# Patient Record
Sex: Female | Born: 1980 | Race: White | Hispanic: No | Marital: Single | Smoking: Former smoker
Health system: Southern US, Community
[De-identification: ages and names within clinical notes are randomized; demographics above are authoritative.]

## PROBLEM LIST (undated history)

## (undated) DIAGNOSIS — Z789 Other specified health status: Secondary | ICD-10-CM

## (undated) HISTORY — PX: NO PAST SURGERIES: SHX2092

## (undated) HISTORY — DX: Other specified health status: Z78.9

---

## 2020-04-18 ENCOUNTER — Encounter: Payer: Self-pay | Admitting: Obstetrics and Gynecology

## 2020-04-23 ENCOUNTER — Other Ambulatory Visit: Payer: Self-pay

## 2020-06-01 ENCOUNTER — Other Ambulatory Visit: Payer: Self-pay | Admitting: Obstetrics and Gynecology

## 2020-06-01 DIAGNOSIS — Z3A20 20 weeks gestation of pregnancy: Secondary | ICD-10-CM

## 2020-06-04 ENCOUNTER — Encounter: Payer: Self-pay | Admitting: *Deleted

## 2020-06-06 ENCOUNTER — Ambulatory Visit: Payer: No Typology Code available for payment source | Admitting: *Deleted

## 2020-06-06 ENCOUNTER — Other Ambulatory Visit: Payer: Self-pay | Admitting: *Deleted

## 2020-06-06 ENCOUNTER — Other Ambulatory Visit: Payer: Self-pay

## 2020-06-06 ENCOUNTER — Ambulatory Visit: Payer: No Typology Code available for payment source

## 2020-06-06 ENCOUNTER — Ambulatory Visit: Payer: No Typology Code available for payment source | Attending: Obstetrics and Gynecology

## 2020-06-06 VITALS — BP 97/63 | HR 76 | Ht 66.0 in

## 2020-06-06 VITALS — Wt 153.7 lb

## 2020-06-06 DIAGNOSIS — O09522 Supervision of elderly multigravida, second trimester: Secondary | ICD-10-CM | POA: Diagnosis present

## 2020-06-06 DIAGNOSIS — O26842 Uterine size-date discrepancy, second trimester: Secondary | ICD-10-CM | POA: Diagnosis not present

## 2020-06-06 DIAGNOSIS — Z3A20 20 weeks gestation of pregnancy: Secondary | ICD-10-CM | POA: Insufficient documentation

## 2020-06-06 DIAGNOSIS — Z363 Encounter for antenatal screening for malformations: Secondary | ICD-10-CM | POA: Diagnosis not present

## 2020-06-06 DIAGNOSIS — Z1379 Encounter for other screening for genetic and chromosomal anomalies: Secondary | ICD-10-CM | POA: Diagnosis present

## 2020-06-06 DIAGNOSIS — O365990 Maternal care for other known or suspected poor fetal growth, unspecified trimester, not applicable or unspecified: Secondary | ICD-10-CM

## 2020-06-06 DIAGNOSIS — Z3A21 21 weeks gestation of pregnancy: Secondary | ICD-10-CM

## 2020-06-11 ENCOUNTER — Telehealth: Payer: Self-pay | Admitting: Genetic Counselor

## 2020-06-11 LAB — MATERNIT21 PLUS CORE+SCA
Fetal Fraction: 8
Monosomy X (Turner Syndrome): NOT DETECTED
Result (T21): NEGATIVE
Trisomy 13 (Patau syndrome): NEGATIVE
Trisomy 18 (Edwards syndrome): NEGATIVE
Trisomy 21 (Down syndrome): NEGATIVE
XXX (Triple X Syndrome): NOT DETECTED
XXY (Klinefelter Syndrome): NOT DETECTED
XYY (Jacobs Syndrome): NOT DETECTED

## 2020-06-11 NOTE — Telephone Encounter (Signed)
I called Ms. Castille to discuss her negative noninvasive prenatal screening (NIPS)/cell free DNA (cfDNA) testing result. Specifically, Ms. Baskerville had MaterniT21 testing through [employer]. Testing was offered because of a two vessel cord identified on her anatomy ultrasound. These negative results demonstrated an expected representation of chromosome 21, 18, 13, and sex chromosome material, greatly reducing the likelihood of trisomies 28, 62, or 60 and sex chromosome aneuploidies for the pregnancy. The expected fetal sex was confirmed to be female. Ms. Esch was counseled that the two vessel cord on ultrasound is likely a normal variant in light of these results.  NIPS analyzes placental (fetal) DNA in maternal circulation. NIPS is considered to be highly specific and sensitive, but is not considered to be a diagnostic test. This testing identifies 91-99% of pregnancies with trisomies 21, 13, and 18, as well as sex chromosome abnormalities, but does not test for all genetic conditions. Diagnostic testing via amniocentesis is available should she be interested in confirming this result. She confirmed that she had no questions about these results at this time.  Gershon Crane, MS, [facility] Genetic Counselor

## 2020-06-13 ENCOUNTER — Encounter: Payer: Self-pay | Admitting: Pediatric Cardiology

## 2020-06-27 ENCOUNTER — Other Ambulatory Visit: Payer: Self-pay | Admitting: Obstetrics

## 2020-06-27 ENCOUNTER — Ambulatory Visit: Payer: No Typology Code available for payment source | Attending: Obstetrics and Gynecology

## 2020-06-27 ENCOUNTER — Ambulatory Visit: Payer: No Typology Code available for payment source | Admitting: *Deleted

## 2020-06-27 ENCOUNTER — Other Ambulatory Visit: Payer: Self-pay

## 2020-06-27 VITALS — BP 95/72 | HR 90

## 2020-06-27 DIAGNOSIS — O365990 Maternal care for other known or suspected poor fetal growth, unspecified trimester, not applicable or unspecified: Secondary | ICD-10-CM | POA: Diagnosis not present

## 2020-06-27 DIAGNOSIS — O09522 Supervision of elderly multigravida, second trimester: Secondary | ICD-10-CM | POA: Diagnosis not present

## 2020-06-27 DIAGNOSIS — O09529 Supervision of elderly multigravida, unspecified trimester: Secondary | ICD-10-CM | POA: Diagnosis present

## 2020-06-27 DIAGNOSIS — O26842 Uterine size-date discrepancy, second trimester: Secondary | ICD-10-CM

## 2020-06-27 DIAGNOSIS — O365920 Maternal care for other known or suspected poor fetal growth, second trimester, not applicable or unspecified: Secondary | ICD-10-CM

## 2020-06-27 DIAGNOSIS — Z3A23 23 weeks gestation of pregnancy: Secondary | ICD-10-CM

## 2020-06-28 ENCOUNTER — Other Ambulatory Visit: Payer: Self-pay | Admitting: *Deleted

## 2020-07-02 ENCOUNTER — Encounter: Payer: Self-pay | Admitting: Obstetrics and Gynecology

## 2020-07-03 ENCOUNTER — Ambulatory Visit: Payer: No Typology Code available for payment source | Attending: Obstetrics and Gynecology

## 2020-07-03 ENCOUNTER — Ambulatory Visit: Payer: No Typology Code available for payment source | Admitting: *Deleted

## 2020-07-03 ENCOUNTER — Other Ambulatory Visit: Payer: Self-pay

## 2020-07-03 ENCOUNTER — Encounter: Payer: Self-pay | Admitting: *Deleted

## 2020-07-03 VITALS — BP 103/63 | HR 77

## 2020-07-03 DIAGNOSIS — O26842 Uterine size-date discrepancy, second trimester: Secondary | ICD-10-CM

## 2020-07-03 DIAGNOSIS — O09522 Supervision of elderly multigravida, second trimester: Secondary | ICD-10-CM

## 2020-07-03 DIAGNOSIS — O09529 Supervision of elderly multigravida, unspecified trimester: Secondary | ICD-10-CM

## 2020-07-03 DIAGNOSIS — O365920 Maternal care for other known or suspected poor fetal growth, second trimester, not applicable or unspecified: Secondary | ICD-10-CM

## 2020-07-03 DIAGNOSIS — Z3A24 24 weeks gestation of pregnancy: Secondary | ICD-10-CM

## 2020-07-10 ENCOUNTER — Ambulatory Visit: Payer: No Typology Code available for payment source | Attending: Obstetrics and Gynecology

## 2020-07-10 ENCOUNTER — Encounter: Payer: Self-pay | Admitting: *Deleted

## 2020-07-10 ENCOUNTER — Other Ambulatory Visit: Payer: Self-pay

## 2020-07-10 ENCOUNTER — Ambulatory Visit: Payer: No Typology Code available for payment source | Admitting: *Deleted

## 2020-07-10 VITALS — BP 96/64 | HR 74

## 2020-07-10 DIAGNOSIS — O368920 Maternal care for other specified fetal problems, second trimester, not applicable or unspecified: Secondary | ICD-10-CM | POA: Diagnosis not present

## 2020-07-10 DIAGNOSIS — O365920 Maternal care for other known or suspected poor fetal growth, second trimester, not applicable or unspecified: Secondary | ICD-10-CM | POA: Diagnosis not present

## 2020-07-10 DIAGNOSIS — O365990 Maternal care for other known or suspected poor fetal growth, unspecified trimester, not applicable or unspecified: Secondary | ICD-10-CM

## 2020-07-10 DIAGNOSIS — O09522 Supervision of elderly multigravida, second trimester: Secondary | ICD-10-CM | POA: Diagnosis not present

## 2020-07-10 DIAGNOSIS — Z3A25 25 weeks gestation of pregnancy: Secondary | ICD-10-CM

## 2020-07-10 DIAGNOSIS — O322XX0 Maternal care for transverse and oblique lie, not applicable or unspecified: Secondary | ICD-10-CM | POA: Diagnosis not present

## 2020-07-17 ENCOUNTER — Ambulatory Visit: Payer: No Typology Code available for payment source

## 2020-07-17 ENCOUNTER — Ambulatory Visit: Payer: No Typology Code available for payment source | Attending: Obstetrics and Gynecology

## 2020-07-17 ENCOUNTER — Other Ambulatory Visit: Payer: Self-pay | Admitting: *Deleted

## 2020-07-17 ENCOUNTER — Ambulatory Visit: Payer: No Typology Code available for payment source | Admitting: *Deleted

## 2020-07-17 ENCOUNTER — Other Ambulatory Visit: Payer: Self-pay

## 2020-07-17 DIAGNOSIS — O365920 Maternal care for other known or suspected poor fetal growth, second trimester, not applicable or unspecified: Secondary | ICD-10-CM | POA: Diagnosis not present

## 2020-07-17 DIAGNOSIS — O09522 Supervision of elderly multigravida, second trimester: Secondary | ICD-10-CM | POA: Diagnosis not present

## 2020-07-17 DIAGNOSIS — O26842 Uterine size-date discrepancy, second trimester: Secondary | ICD-10-CM | POA: Diagnosis not present

## 2020-07-17 DIAGNOSIS — Z3A26 26 weeks gestation of pregnancy: Secondary | ICD-10-CM

## 2020-07-17 DIAGNOSIS — O09523 Supervision of elderly multigravida, third trimester: Secondary | ICD-10-CM

## 2020-08-14 ENCOUNTER — Ambulatory Visit: Payer: No Typology Code available for payment source | Admitting: *Deleted

## 2020-08-14 ENCOUNTER — Ambulatory Visit: Payer: No Typology Code available for payment source | Attending: Obstetrics and Gynecology

## 2020-08-14 ENCOUNTER — Other Ambulatory Visit: Payer: Self-pay

## 2020-08-14 ENCOUNTER — Other Ambulatory Visit: Payer: Self-pay | Admitting: *Deleted

## 2020-08-14 DIAGNOSIS — Z3A30 30 weeks gestation of pregnancy: Secondary | ICD-10-CM

## 2020-08-14 DIAGNOSIS — O09523 Supervision of elderly multigravida, third trimester: Secondary | ICD-10-CM | POA: Diagnosis present

## 2020-08-14 DIAGNOSIS — O368930 Maternal care for other specified fetal problems, third trimester, not applicable or unspecified: Secondary | ICD-10-CM

## 2020-08-14 DIAGNOSIS — O26843 Uterine size-date discrepancy, third trimester: Secondary | ICD-10-CM

## 2020-08-21 ENCOUNTER — Ambulatory Visit: Payer: No Typology Code available for payment source

## 2020-09-11 ENCOUNTER — Other Ambulatory Visit: Payer: Self-pay

## 2020-09-11 ENCOUNTER — Ambulatory Visit: Payer: No Typology Code available for payment source | Admitting: *Deleted

## 2020-09-11 ENCOUNTER — Encounter: Payer: Self-pay | Admitting: *Deleted

## 2020-09-11 ENCOUNTER — Ambulatory Visit: Payer: No Typology Code available for payment source | Attending: Maternal & Fetal Medicine

## 2020-09-11 DIAGNOSIS — Z3A34 34 weeks gestation of pregnancy: Secondary | ICD-10-CM

## 2020-09-11 DIAGNOSIS — O09523 Supervision of elderly multigravida, third trimester: Secondary | ICD-10-CM

## 2020-09-11 DIAGNOSIS — O321XX0 Maternal care for breech presentation, not applicable or unspecified: Secondary | ICD-10-CM | POA: Diagnosis not present

## 2020-09-11 DIAGNOSIS — Z362 Encounter for other antenatal screening follow-up: Secondary | ICD-10-CM

## 2020-09-11 DIAGNOSIS — O26843 Uterine size-date discrepancy, third trimester: Secondary | ICD-10-CM

## 2020-09-28 ENCOUNTER — Inpatient Hospital Stay (HOSPITAL_COMMUNITY): Payer: No Typology Code available for payment source | Admitting: Anesthesiology

## 2020-09-28 ENCOUNTER — Ambulatory Visit: Payer: No Typology Code available for payment source | Admitting: *Deleted

## 2020-09-28 ENCOUNTER — Encounter (HOSPITAL_COMMUNITY)
Admission: AD | Disposition: A | Discharge status: Home / Self Care | Payer: Self-pay | Source: Home / Self Care | Attending: Obstetrics & Gynecology

## 2020-09-28 ENCOUNTER — Encounter (HOSPITAL_COMMUNITY): Payer: Self-pay | Admitting: Obstetrics & Gynecology

## 2020-09-28 ENCOUNTER — Other Ambulatory Visit: Payer: Self-pay | Admitting: Obstetrics and Gynecology

## 2020-09-28 ENCOUNTER — Inpatient Hospital Stay (HOSPITAL_COMMUNITY)
Admission: AD | Admit: 2020-09-28 | Discharge: 2020-10-01 | DRG: 788 | Disposition: A | Discharge status: Home / Self Care | Payer: No Typology Code available for payment source | Attending: Obstetrics & Gynecology | Admitting: Obstetrics & Gynecology

## 2020-09-28 ENCOUNTER — Encounter: Payer: Self-pay | Admitting: *Deleted

## 2020-09-28 ENCOUNTER — Other Ambulatory Visit: Payer: Self-pay

## 2020-09-28 ENCOUNTER — Other Ambulatory Visit: Payer: Self-pay | Admitting: *Deleted

## 2020-09-28 ENCOUNTER — Ambulatory Visit (HOSPITAL_BASED_OUTPATIENT_CLINIC_OR_DEPARTMENT_OTHER): Payer: No Typology Code available for payment source

## 2020-09-28 DIAGNOSIS — Z3A37 37 weeks gestation of pregnancy: Secondary | ICD-10-CM | POA: Diagnosis not present

## 2020-09-28 DIAGNOSIS — O99824 Streptococcus B carrier state complicating childbirth: Secondary | ICD-10-CM | POA: Diagnosis present

## 2020-09-28 DIAGNOSIS — O43123 Velamentous insertion of umbilical cord, third trimester: Secondary | ICD-10-CM | POA: Diagnosis present

## 2020-09-28 DIAGNOSIS — Q513 Bicornate uterus: Secondary | ICD-10-CM | POA: Diagnosis not present

## 2020-09-28 DIAGNOSIS — O403XX0 Polyhydramnios, third trimester, not applicable or unspecified: Secondary | ICD-10-CM | POA: Insufficient documentation

## 2020-09-28 DIAGNOSIS — O09899 Supervision of other high risk pregnancies, unspecified trimester: Secondary | ICD-10-CM

## 2020-09-28 DIAGNOSIS — O326XX0 Maternal care for compound presentation, not applicable or unspecified: Secondary | ICD-10-CM | POA: Diagnosis present

## 2020-09-28 DIAGNOSIS — O26843 Uterine size-date discrepancy, third trimester: Secondary | ICD-10-CM | POA: Diagnosis not present

## 2020-09-28 DIAGNOSIS — O99892 Other specified diseases and conditions complicating childbirth: Secondary | ICD-10-CM | POA: Diagnosis not present

## 2020-09-28 DIAGNOSIS — O3403 Maternal care for unspecified congenital malformation of uterus, third trimester: Secondary | ICD-10-CM | POA: Diagnosis present

## 2020-09-28 DIAGNOSIS — O09523 Supervision of elderly multigravida, third trimester: Secondary | ICD-10-CM | POA: Diagnosis not present

## 2020-09-28 DIAGNOSIS — O3483 Maternal care for other abnormalities of pelvic organs, third trimester: Secondary | ICD-10-CM | POA: Diagnosis present

## 2020-09-28 DIAGNOSIS — Z3A36 36 weeks gestation of pregnancy: Secondary | ICD-10-CM

## 2020-09-28 DIAGNOSIS — Z87891 Personal history of nicotine dependence: Secondary | ICD-10-CM

## 2020-09-28 DIAGNOSIS — O365930 Maternal care for other known or suspected poor fetal growth, third trimester, not applicable or unspecified: Secondary | ICD-10-CM | POA: Diagnosis present

## 2020-09-28 DIAGNOSIS — Z20822 Contact with and (suspected) exposure to covid-19: Secondary | ICD-10-CM | POA: Diagnosis present

## 2020-09-28 DIAGNOSIS — N83201 Unspecified ovarian cyst, right side: Secondary | ICD-10-CM | POA: Diagnosis present

## 2020-09-28 DIAGNOSIS — O43193 Other malformation of placenta, third trimester: Secondary | ICD-10-CM

## 2020-09-28 LAB — TYPE AND SCREEN
ABO/RH(D): O POS
Antibody Screen: NEGATIVE

## 2020-09-28 LAB — OB RESULTS CONSOLE RUBELLA ANTIBODY, IGM: Rubella: IMMUNE

## 2020-09-28 LAB — CBC
HCT: 35.1 % — ABNORMAL LOW (ref 36.0–46.0)
Hemoglobin: 11.9 g/dL — ABNORMAL LOW (ref 12.0–15.0)
MCH: 31.8 pg (ref 26.0–34.0)
MCHC: 33.9 g/dL (ref 30.0–36.0)
MCV: 93.9 fL (ref 80.0–100.0)
Platelets: 195 10*3/uL (ref 150–400)
RBC: 3.74 MIL/uL — ABNORMAL LOW (ref 3.87–5.11)
RDW: 13.2 % (ref 11.5–15.5)
WBC: 10.5 10*3/uL (ref 4.0–10.5)
nRBC: 0 % (ref 0.0–0.2)

## 2020-09-28 LAB — OB RESULTS CONSOLE GC/CHLAMYDIA
Chlamydia: NEGATIVE
Gonorrhea: NEGATIVE

## 2020-09-28 LAB — OB RESULTS CONSOLE RPR: RPR: NONREACTIVE

## 2020-09-28 LAB — OB RESULTS CONSOLE ANTIBODY SCREEN: Antibody Screen: NEGATIVE

## 2020-09-28 LAB — OB RESULTS CONSOLE ABO/RH: RH Type: POSITIVE

## 2020-09-28 LAB — GROUP B STREP BY PCR: Group B strep by PCR: POSITIVE — AB

## 2020-09-28 LAB — RESPIRATORY PANEL BY RT PCR (FLU A&B, COVID)
Influenza A by PCR: NEGATIVE
Influenza B by PCR: NEGATIVE
SARS Coronavirus 2 by RT PCR: NEGATIVE

## 2020-09-28 LAB — OB RESULTS CONSOLE HEPATITIS B SURFACE ANTIGEN: Hepatitis B Surface Ag: NEGATIVE

## 2020-09-28 LAB — OB RESULTS CONSOLE HIV ANTIBODY (ROUTINE TESTING): HIV: NONREACTIVE

## 2020-09-28 SURGERY — Surgical Case
Anesthesia: Epidural

## 2020-09-28 MED ORDER — PROMETHAZINE HCL 25 MG/ML IJ SOLN
12.5000 mg | Freq: Once | INTRAMUSCULAR | Status: AC
  Administered 2020-09-28: 12.5 mg via INTRAVENOUS
  Filled 2020-09-28: qty 1

## 2020-09-28 MED ORDER — EPHEDRINE 5 MG/ML INJ: 10.0000 mg | INTRAVENOUS | Status: DC | PRN

## 2020-09-28 MED ORDER — OXYCODONE-ACETAMINOPHEN 5-325 MG PO TABS: 1.0000 | ORAL_TABLET | ORAL | Status: DC | PRN

## 2020-09-28 MED ORDER — DIPHENHYDRAMINE HCL 50 MG/ML IJ SOLN: 12.5000 mg | INTRAMUSCULAR | Status: DC | PRN

## 2020-09-28 MED ORDER — MEPERIDINE HCL 25 MG/ML IJ SOLN
INTRAMUSCULAR | Status: AC
  Filled 2020-09-28: qty 1

## 2020-09-28 MED ORDER — LACTATED RINGERS IV SOLN
500.0000 mL | INTRAVENOUS | Status: DC | PRN
  Administered 2020-09-28: 250 mL via INTRAVENOUS

## 2020-09-28 MED ORDER — LACTATED RINGERS IV SOLN: 500.0000 mL | Freq: Once | INTRAVENOUS | Status: DC

## 2020-09-28 MED ORDER — MEPERIDINE HCL 25 MG/ML IJ SOLN
INTRAMUSCULAR | Status: DC | PRN
Start: 2020-09-28 — End: 2020-09-28
  Administered 2020-09-28: 6.25 mg via INTRAVENOUS

## 2020-09-28 MED ORDER — PHENYLEPHRINE 40 MCG/ML (10ML) SYRINGE FOR IV PUSH (FOR BLOOD PRESSURE SUPPORT): 80.0000 ug | PREFILLED_SYRINGE | INTRAVENOUS | Status: DC | PRN

## 2020-09-28 MED ORDER — TERBUTALINE SULFATE 1 MG/ML IJ SOLN
0.2500 mg | Freq: Once | INTRAMUSCULAR | Status: AC | PRN
  Administered 2020-09-28: 0.25 mg via SUBCUTANEOUS
  Filled 2020-09-28: qty 1

## 2020-09-28 MED ORDER — LIDOCAINE HCL (PF) 2 % IJ SOLN
INTRAMUSCULAR | Status: AC
  Filled 2020-09-28: qty 5

## 2020-09-28 MED ORDER — CEFAZOLIN SODIUM-DEXTROSE 2-4 GM/100ML-% IV SOLN
2.0000 g | Freq: Once | INTRAVENOUS | Status: AC
  Administered 2020-09-28: 2 g via INTRAVENOUS
  Filled 2020-09-28: qty 100

## 2020-09-28 MED ORDER — DEXMEDETOMIDINE (PRECEDEX) IN NS 20 MCG/5ML (4 MCG/ML) IV SYRINGE
PREFILLED_SYRINGE | INTRAVENOUS | Status: DC | PRN
  Administered 2020-09-28 (×5): 4 ug via INTRAVENOUS

## 2020-09-28 MED ORDER — MISOPROSTOL 50MCG HALF TABLET
ORAL_TABLET | ORAL | Status: AC
  Filled 2020-09-28: qty 1

## 2020-09-28 MED ORDER — LACTATED RINGERS IV SOLN: INTRAVENOUS | Status: DC | PRN

## 2020-09-28 MED ORDER — CEFAZOLIN SODIUM-DEXTROSE 2-4 GM/100ML-% IV SOLN
INTRAVENOUS | Status: AC
  Filled 2020-09-28: qty 100

## 2020-09-28 MED ORDER — SODIUM CHLORIDE 0.9 % IR SOLN
Status: DC | PRN
  Administered 2020-09-28: 1000 mL

## 2020-09-28 MED ORDER — SODIUM BICARBONATE 8.4 % IV SOLN
INTRAVENOUS | Status: AC
  Filled 2020-09-28: qty 50

## 2020-09-28 MED ORDER — KETOROLAC TROMETHAMINE 30 MG/ML IJ SOLN
INTRAMUSCULAR | Status: AC
  Filled 2020-09-28: qty 1

## 2020-09-28 MED ORDER — LIDOCAINE HCL (PF) 1 % IJ SOLN: 30.0000 mL | INTRAMUSCULAR | Status: DC | PRN

## 2020-09-28 MED ORDER — MORPHINE SULFATE (PF) 10 MG/ML IV SOLN
INTRAVENOUS | Status: DC | PRN
  Administered 2020-09-28: 3 mg via EPIDURAL

## 2020-09-28 MED ORDER — FENTANYL CITRATE (PF) 100 MCG/2ML IJ SOLN
INTRAMUSCULAR | Status: DC | PRN
  Administered 2020-09-28: 50 ug via INTRAVENOUS

## 2020-09-28 MED ORDER — CEFAZOLIN SODIUM-DEXTROSE 2-3 GM-%(50ML) IV SOLR
INTRAVENOUS | Status: DC | PRN
  Administered 2020-09-28: 2 g via INTRAVENOUS

## 2020-09-28 MED ORDER — ONDANSETRON HCL 4 MG/2ML IJ SOLN
INTRAMUSCULAR | Status: AC
  Filled 2020-09-28: qty 4

## 2020-09-28 MED ORDER — KETOROLAC TROMETHAMINE 30 MG/ML IJ SOLN: 30.0000 mg | Freq: Once | INTRAMUSCULAR | Status: AC | PRN

## 2020-09-28 MED ORDER — PROMETHAZINE HCL 25 MG/ML IJ SOLN: 6.2500 mg | INTRAMUSCULAR | Status: DC | PRN

## 2020-09-28 MED ORDER — SOD CITRATE-CITRIC ACID 500-334 MG/5ML PO SOLN
30.0000 mL | ORAL | Status: DC | PRN
  Administered 2020-09-28: 30 mL via ORAL
  Filled 2020-09-28: qty 15

## 2020-09-28 MED ORDER — DEXMEDETOMIDINE (PRECEDEX) IN NS 20 MCG/5ML (4 MCG/ML) IV SYRINGE
PREFILLED_SYRINGE | INTRAVENOUS | Status: AC
  Filled 2020-09-28: qty 5

## 2020-09-28 MED ORDER — LIDOCAINE-EPINEPHRINE (PF) 2 %-1:200000 IJ SOLN
INTRAMUSCULAR | Status: DC | PRN
  Administered 2020-09-28: 3 mL via EPIDURAL
  Administered 2020-09-28: 10 mL via EPIDURAL

## 2020-09-28 MED ORDER — OXYTOCIN BOLUS FROM INFUSION: 333.0000 mL | Freq: Once | INTRAVENOUS | Status: DC

## 2020-09-28 MED ORDER — ACETAMINOPHEN 325 MG PO TABS: 650.0000 mg | ORAL_TABLET | ORAL | Status: DC | PRN

## 2020-09-28 MED ORDER — FENTANYL CITRATE (PF) 100 MCG/2ML IJ SOLN
INTRAMUSCULAR | Status: AC
  Filled 2020-09-28: qty 2

## 2020-09-28 MED ORDER — PHENYLEPHRINE HCL (PRESSORS) 10 MG/ML IV SOLN
INTRAVENOUS | Status: DC | PRN
  Administered 2020-09-28 (×2): 80 ug via INTRAVENOUS
  Administered 2020-09-28: 40 ug via INTRAVENOUS

## 2020-09-28 MED ORDER — OXYTOCIN-SODIUM CHLORIDE 30-0.9 UT/500ML-% IV SOLN
2.5000 [IU]/h | INTRAVENOUS | Status: DC
  Administered 2020-09-28: 30 [IU] via INTRAVENOUS

## 2020-09-28 MED ORDER — HYDROMORPHONE HCL 1 MG/ML IJ SOLN
0.2500 mg | INTRAMUSCULAR | Status: DC | PRN
  Administered 2020-09-28: 0.5 mg via INTRAVENOUS

## 2020-09-28 MED ORDER — LIDOCAINE HCL (PF) 1 % IJ SOLN
INTRAMUSCULAR | Status: DC | PRN
  Administered 2020-09-28: 9 mL via EPIDURAL

## 2020-09-28 MED ORDER — OXYCODONE-ACETAMINOPHEN 5-325 MG PO TABS: 2.0000 | ORAL_TABLET | ORAL | Status: DC | PRN

## 2020-09-28 MED ORDER — MEPERIDINE HCL 25 MG/ML IJ SOLN: 6.2500 mg | INTRAMUSCULAR | Status: DC | PRN

## 2020-09-28 MED ORDER — DEXAMETHASONE SODIUM PHOSPHATE 4 MG/ML IJ SOLN
INTRAMUSCULAR | Status: DC | PRN
  Administered 2020-09-28: 4 mg via INTRAVENOUS

## 2020-09-28 MED ORDER — MISOPROSTOL 50MCG HALF TABLET
50.0000 ug | ORAL_TABLET | ORAL | Status: DC | PRN
  Administered 2020-09-28: 50 ug via ORAL

## 2020-09-28 MED ORDER — FENTANYL CITRATE (PF) 100 MCG/2ML IJ SOLN
INTRAMUSCULAR | Status: DC | PRN
Start: 2020-09-28 — End: 2020-09-28
  Administered 2020-09-28: 50 ug via EPIDURAL

## 2020-09-28 MED ORDER — FENTANYL-BUPIVACAINE-NACL 0.5-0.125-0.9 MG/250ML-% EP SOLN
12.0000 mL/h | EPIDURAL | Status: DC | PRN
  Filled 2020-09-28: qty 250

## 2020-09-28 MED ORDER — FENTANYL CITRATE (PF) 100 MCG/2ML IJ SOLN
100.0000 ug | INTRAMUSCULAR | Status: DC | PRN
  Administered 2020-09-28 (×2): 100 ug via INTRAVENOUS
  Filled 2020-09-28: qty 2

## 2020-09-28 MED ORDER — KETOROLAC TROMETHAMINE 30 MG/ML IJ SOLN
30.0000 mg | Freq: Four times a day (QID) | INTRAMUSCULAR | Status: DC | PRN
  Administered 2020-09-29: 30 mg via INTRAMUSCULAR

## 2020-09-28 MED ORDER — ONDANSETRON HCL 4 MG/2ML IJ SOLN
4.0000 mg | Freq: Four times a day (QID) | INTRAMUSCULAR | Status: DC | PRN
  Administered 2020-09-28 (×2): 4 mg via INTRAVENOUS
  Filled 2020-09-28: qty 2

## 2020-09-28 MED ORDER — MORPHINE SULFATE (PF) 0.5 MG/ML IJ SOLN
INTRAMUSCULAR | Status: AC
  Filled 2020-09-28: qty 10

## 2020-09-28 MED ORDER — OXYTOCIN 10 UNIT/ML IJ SOLN
INTRAMUSCULAR | Status: AC
  Filled 2020-09-28: qty 4

## 2020-09-28 MED ORDER — CEFAZOLIN SODIUM-DEXTROSE 1-4 GM/50ML-% IV SOLN
1.0000 g | Freq: Three times a day (TID) | INTRAVENOUS | Status: DC
  Filled 2020-09-28: qty 50

## 2020-09-28 MED ORDER — SODIUM CHLORIDE (PF) 0.9 % IJ SOLN
INTRAMUSCULAR | Status: DC | PRN
  Administered 2020-09-28: 12 mL/h via EPIDURAL

## 2020-09-28 MED ORDER — LACTATED RINGERS IV SOLN: INTRAVENOUS | Status: DC

## 2020-09-28 MED ORDER — FLEET ENEMA 7-19 GM/118ML RE ENEM: 1.0000 | ENEMA | RECTAL | Status: DC | PRN

## 2020-09-28 MED ORDER — OXYTOCIN-SODIUM CHLORIDE 30-0.9 UT/500ML-% IV SOLN
INTRAVENOUS | Status: AC
  Filled 2020-09-28: qty 500

## 2020-09-28 MED ORDER — HYDROMORPHONE HCL 1 MG/ML IJ SOLN
INTRAMUSCULAR | Status: AC
  Filled 2020-09-28: qty 0.5

## 2020-09-28 MED ORDER — KETOROLAC TROMETHAMINE 30 MG/ML IJ SOLN: 30.0000 mg | Freq: Four times a day (QID) | INTRAMUSCULAR | Status: DC | PRN

## 2020-09-28 MED ORDER — PHENYLEPHRINE 40 MCG/ML (10ML) SYRINGE FOR IV PUSH (FOR BLOOD PRESSURE SUPPORT)
PREFILLED_SYRINGE | INTRAVENOUS | Status: AC
  Filled 2020-09-28: qty 10

## 2020-09-28 MED ORDER — DEXAMETHASONE SODIUM PHOSPHATE 4 MG/ML IJ SOLN
INTRAMUSCULAR | Status: AC
  Filled 2020-09-28: qty 1

## 2020-09-28 SURGICAL SUPPLY — 40 items
BARRIER ADHS 3X4 INTERCEED (GAUZE/BANDAGES/DRESSINGS) ×3 IMPLANT
BENZOIN TINCTURE PRP APPL 2/3 (GAUZE/BANDAGES/DRESSINGS) ×3 IMPLANT
CHLORAPREP W/TINT 26ML (MISCELLANEOUS) ×3 IMPLANT
CLAMP CORD UMBIL (MISCELLANEOUS) IMPLANT
CLOSURE STERI STRIP 1/2 X4 (GAUZE/BANDAGES/DRESSINGS) ×3 IMPLANT
CLOTH BEACON ORANGE TIMEOUT ST (SAFETY) ×3 IMPLANT
DERMABOND ADVANCED (GAUZE/BANDAGES/DRESSINGS)
DERMABOND ADVANCED .7 DNX12 (GAUZE/BANDAGES/DRESSINGS) IMPLANT
DRAPE C SECTION CLR SCREEN (DRAPES) ×3 IMPLANT
DRSG OPSITE POSTOP 4X10 (GAUZE/BANDAGES/DRESSINGS) ×3 IMPLANT
ELECT REM PT RETURN 9FT ADLT (ELECTROSURGICAL) ×3
ELECTRODE REM PT RTRN 9FT ADLT (ELECTROSURGICAL) ×1 IMPLANT
EXTRACTOR VACUUM M CUP 4 TUBE (SUCTIONS) IMPLANT
EXTRACTOR VACUUM M CUP 4' TUBE (SUCTIONS)
GLOVE BIOGEL PI IND STRL 7.0 (GLOVE) ×2 IMPLANT
GLOVE BIOGEL PI INDICATOR 7.0 (GLOVE) ×4
GLOVE SURG SS PI 6.5 STRL IVOR (GLOVE) ×3 IMPLANT
GOWN STRL REUS W/TWL LRG LVL3 (GOWN DISPOSABLE) ×6 IMPLANT
KIT ABG SYR 3ML LUER SLIP (SYRINGE) IMPLANT
NEEDLE HYPO 25X5/8 SAFETYGLIDE (NEEDLE) IMPLANT
NS IRRIG 1000ML POUR BTL (IV SOLUTION) ×3 IMPLANT
PACK C SECTION WH (CUSTOM PROCEDURE TRAY) ×3 IMPLANT
PAD OB MATERNITY 4.3X12.25 (PERSONAL CARE ITEMS) ×3 IMPLANT
PENCIL SMOKE EVAC W/HOLSTER (ELECTROSURGICAL) ×3 IMPLANT
RTRCTR C-SECT PINK 25CM LRG (MISCELLANEOUS) ×3 IMPLANT
SUT CHROMIC 1 CTX 36 (SUTURE) IMPLANT
SUT CHROMIC 2 0 CT 1 (SUTURE) ×3 IMPLANT
SUT MON AB 4-0 PS1 27 (SUTURE) ×3 IMPLANT
SUT PLAIN 1 NONE 54 (SUTURE) IMPLANT
SUT PLAIN 2 0 (SUTURE) ×2
SUT PLAIN 2 0 XLH (SUTURE) IMPLANT
SUT PLAIN ABS 2-0 CT1 27XMFL (SUTURE) ×1 IMPLANT
SUT VIC AB 0 CTX 36 (SUTURE) ×4
SUT VIC AB 0 CTX36XBRD ANBCTRL (SUTURE) ×2 IMPLANT
SUT VIC AB 1 CTX 36 (SUTURE) ×4
SUT VIC AB 1 CTX36XBRD ANBCTRL (SUTURE) ×2 IMPLANT
SUT VIC AB 4-0 KS 27 (SUTURE) ×3 IMPLANT
TOWEL OR 17X24 6PK STRL BLUE (TOWEL DISPOSABLE) ×3 IMPLANT
TRAY FOLEY W/BAG SLVR 14FR LF (SET/KITS/TRAYS/PACK) ×3 IMPLANT
WATER STERILE IRR 1000ML POUR (IV SOLUTION) ×3 IMPLANT

## 2020-09-28 NOTE — Op Note (Signed)
Audria Nine PROCEDURE DATE: 09/28/2020  PREOPERATIVE DIAGNOSES: Intrauterine pregnancy at [redacted]w[redacted]d weeks gestation; non-reassuring fetal status and cord prolapse  POSTOPERATIVE DIAGNOSES: The same, right ovarian cyst, bicornuate uterus  PROCEDURE: Primary Low Transverse Cesarean Section  SURGEON:  Baldemar Lenis, MD  ASSISTANT:  Ivonne Andrew, CNM Casper Harrison, MD  An experienced assistant was required given the standard of surgical care given the complexity of the case.  This assistant was needed for exposure, dissection, suctioning, retraction, instrument exchange, assisting with delivery with administration of fundal pressure, and for overall help during the procedure.  ANESTHESIOLOGY TEAM: Anesthesiologist: Leilani Able, MD CRNA: Renford Dills, CRNA; Rhymer, Doree Fudge, CRNA  INDICATIONS: Michelle Reid is a 39 y.o. G3P0020 at [redacted]w[redacted]d here for cesarean section secondary to the indications listed under preoperative diagnoses; please see preoperative note for further details.  Risks of c-section not discussed with patient due to urgent nature of case, pt did state she was agreeable to blood transfusion if needed.  FINDINGS:  Viable female infant in cephalic presentation. Very poor tone with no spontaneous movement, respiration or cry. Apgars and cord gas pending. Weight: 2520 gms. Clear amniotic fluid.  Intact placenta, three vessel cord.  Bicornuate uterus with placenta in left horn, enlarged right ovary with cyst.  ANESTHESIA: Epidural INTRAVENOUS FLUIDS: 2000 ml   ESTIMATED BLOOD LOSS:  ml URINE OUTPUT:  300 ml SPECIMENS: Placenta sent to pathology COMPLICATIONS: None immediate  PROCEDURE IN DETAIL:  The patient was taken to the OR urgently for cord prolapse. FHR on arrival to OR in 60s but subsequently improved by palpation for normal. The epidural anesthesia was dosed up to surgical level and was found to be adequate. She was placed in a dorsal supine position with a  leftward tilt. She was prepped and draped in a sterile manner.  A foley catheter was already in place in her bladder.  After a timeout was performed, a Pfannenstiel skin incision was made with scalpel and carried through to the underlying layer of fascia. The fascia was incised in the midline, and this incision was extended bilaterally bluntly. The rectus muscles were separated in the midline bluntly and the peritoneum was entered bluntly. The peritoneal incision was carefully extended bluntly laterally and caudad with good visualization of the bladder. The uterus appeared bicornuate. The Alexis O-ring retractor was placed into the incision, taking care not to incorporate bowel or omentum. The bladder blade was inserted. Attention was turned to the lower uterine segment where a low transverse hysterotomy was made with a scalpel and extended bilaterally bluntly.  The infant was delivered with some difficulty due to the tight incision from cephalic position, cord immediately clamped due to poor tone. Infant handed to waiting NICU staff. Pop felt during delivery of right shoulder, conveyed to NICU staff to check shoulder. Uterine massage was then performed, and the placenta delivered intact with a three-vessel cord. The uterus was then cleared of clots and debris. The hysterotomy was closed with 0 Vicryl in a running locked fashion, and an imbricating layer was also placed with 0 Vicryl. Good hemostasis noted at the uterine incision.  At this point, Dr. Sallye Ober scrubbed in and I turned over the case to her.    Baldemar Lenis, M.D. Attending Obstetrician & Gynecologist, [facility] for [employer], [facility]

## 2020-09-28 NOTE — H&P (Signed)
OB ADMISSION/ HISTORY & PHYSICAL:  Admission Date: 09/28/2020 12:09 PM  Admit Diagnosis: Indication for care in labor or delivery [O75.9]    Michelle Reid is a 39 y.o. female presenting for IOL, direct admit from MFM office. Noted amnion separation in office and confirmed w/ MFM sono today. Recommendation to proceed w/ IOL and delivery.  Patient notes good FM, no LOF/VB. Denies HA/NV/RUQ pain/visual changes.  Partner present and supportive.   Prenatal History: G3P0020   EDC : 10/16/2020, by Last Menstrual Period  Prenatal care at [facility] since 14 weeks.   Prenatal course complicated by: 1. Velamentous CI, no vasa previa, single UA 2. SGA, serial growth wnl, last EFW 20 % at 34 wks 3. AMA 39 yo 4. Mild poly 25.9 cm 5. R ovarian simple cyst stable 8 x 5 x 5.8 cm  Prenatal Labs: ABO, Rh: O (11/12 0000)  Antibody: PENDING (11/12 1235) Rubella: Immune (11/12 0000)  RPR: Nonreactive (11/12 0000)  HBsAg: Negative (11/12 0000)  HIV: Non-reactive (11/12 0000)  GBS:   pending  1 hr Glucola : 110 Genetic Screening: declined Ultrasound: normal XX anatomy, VCI, SUA, SGA, placenta L lateral  Vaccines: TDaP          UTD         Flu             declined                    COVID-19 declined    Maternal Diabetes: No Genetic Screening: Declined Maternal Ultrasounds/Referrals: IUGR and Other: velamentous CI, single UA Fetal Ultrasounds or other Referrals:  Referred to Materal Fetal Medicine  Maternal Substance Abuse:  No Significant Maternal Medications:  None Significant Maternal Lab Results:  None Other Comments:  None  Medical / Surgical History :  Past medical history:  Past Medical History:  Diagnosis Date   Medical history non-contributory      Past surgical history:  Past Surgical History:  Procedure Laterality Date   NO PAST SURGERIES       Family History: No family history on file.   Social History:  reports that she has quit smoking. She has  never used smokeless tobacco. She reports previous alcohol use. No history on file for drug use.   Allergies: Amoxicillin   Current Medications at time of admission:  Medications Prior to Admission  Medication Sig Dispense Refill Last Dose   Ferrous Sulfate (IRON PO) Take by mouth.      FOLIC ACID PO Take by mouth.      Omega-3 Fatty Acids (FISH OIL PO) Take by mouth.      Prenatal Vit-Fe Fumarate-FA (PRENATAL VITAMIN PO) Take by mouth.      VITAMIN A PO Take by mouth.      VITAMIN D PO Take by mouth.      VITAMIN E PO Take by mouth.        Review of Systems: ROS As noted above Physical Exam: Vital signs and nursing notes reviewed.  Patient Vitals for the past 24 hrs:  BP Temp Temp src Pulse Resp Height Weight  09/28/20 1239 113/74 98 F (36.7 C) Oral 69 18 -- --  09/28/20 1231 -- -- -- -- -- 5\' 6"  (1.676 m) 81.2 kg     General: AAO x 3, NAD Heart: RRR Lungs:CTAB Abdomen: Gravid, NT, Leopold's vertex, fetal spine to maternal R Extremities: no edema Genitalia / VE: Dilation: 1 Effacement (%): 50 Station: Ballotable Presentation:  Vertex Exam by:: Nesanel Aguila, cnm   Option for IF discussed to include R/B, patient agrees to procedure Cervical balloon placed w/o difficulty, inflated w/ 60 cc fluid and traction to leg Patient tolerated well.   FHR: 130 BPM, modertae variability, + accels, no decels TOCO: Ctx occasional, mild  Labs:   Pending T&S, RPR  Recent Labs    09/28/20 1251  WBC 10.5  HGB 11.9*  HCT 35.1*  PLT 195    Assessment:  39 y.o. G3P0020 at [redacted]w[redacted]d AMA, velamentous cord insertion, single umbilical artery, SGA with normal growth Amnion separation Mild poly  1. IOL, unfavorable cervix 2. FHR category 1 3. GBS unknown 4. Desires epidural in active labor 5. Breastfeeding planned 6. Placenta disposal per patient request  Plan:  1. Admit to BS 2. Routine L&D orders, cervical ripening with buccal cytotec and IF 3. Analgesia/anesthesia  PRN  4. Anticipate NSVB   Dr Sallye Ober notified of admission / plan of care   Neta Mends CNM, MSN 09/28/2020, 1:59 PM

## 2020-09-28 NOTE — Progress Notes (Signed)
MFM Note  This patient was seen for an ultrasound exam as a possible amnion separation was noted during an ultrasound performed at the office of [facility].  The patient has been followed due to advanced maternal age, a fetus with a two-vessel umbilical cord with probable velamentous cord insertion.  The patient reports feeling vigorous fetal movements throughout the day and denies any problems since her last exam.  She denies any recent falls or trauma to the abdomen or abdominal pain.  On today's exam, fetal movements were noted.  Mild polyhydramnios with a total AFI of 25.9 cm was also noted.  The amnion or gestational sac appears to be separated from her uterus.  Although a normal appearing left lateral placenta was noted without any signs of retroplacental clots, given the amnion separation noted today, a placental abruption cannot be excluded.    At her current gestational age, I would recommend delivery in order to avoid an adverse pregnancy outcome.    As the velamentous cord insertion site is closer to the fundus of her uterus and there were no signs of vasa previa, she may attempt a vaginal delivery.    The patient was sent to the hospital for delivery following today's ultrasound exam.  The appropriate obstetrical providers were notified regarding today's ultrasound findings and recommendation for delivery.

## 2020-09-28 NOTE — Progress Notes (Signed)
Late entry d/t acuity.  2104 - SROM  2105 - RN called me to notify of SROM w/ clear AF 2107 - spontaneous deceleration to 60 BPM, unrelieved by position changes to L lateral 2108 - at patient BS, noted continued decel, SVE rim, vertex, fetal extremities palpated adjacent to head and cord pulsing between the two presenting parts, attempted to push foot and hand cephalad and unable to reduce between contractions.  2111 - FSE placed and patient assisted to hands/knees position, request for FP attending to bedside consult for emergent section 2112 - Terbutaline 0.25 SQ given, call to Dr. Sallye Ober with update and request presence at bedside for operative delivery, Dr. Connye Burkitt and Dr. Myriam Jacobson at [facility] and examined patient, cord prolapse confirmed. 2114 - Patient to OR via bed, Dr. Myriam Jacobson providing counter pressure on presenting part. FHT remained 60 BPM.  Neta Mends, MSN, CNM 09/28/2020, 11:17 PM

## 2020-09-28 NOTE — Transfer of Care (Signed)
Immediate Anesthesia Transfer of Care Note  Patient: Michelle Reid  Procedure(s) Performed: CESAREAN SECTION (N/A )  Patient Location: PACU  Anesthesia Type:Epidural  Level of Consciousness: awake, alert  and oriented  Airway & Oxygen Therapy: Patient Spontanous Breathing  Post-op Assessment: Report given to RN  Post vital signs: Reviewed and stable  Last Vitals:  Vitals Value Taken Time  BP 110/65 09/28/20 2245  Temp    Pulse 101 09/28/20 2246  Resp 15 09/28/20 2246  SpO2 95 % 09/28/20 2246  Vitals shown include unvalidated device data.  Last Pain:  Vitals:   09/28/20 2025  TempSrc:   PainSc: 0-No pain         Complications: No complications documented.

## 2020-09-28 NOTE — Anesthesia Procedure Notes (Addendum)
Epidural Patient location during procedure: OB Start time: 09/28/2020 6:44 PM End time: 09/28/2020 6:46 PM  Staffing Anesthesiologist: Leilani Able, MD Performed: anesthesiologist   Preanesthetic Checklist Completed: patient identified, IV checked, site marked, risks and benefits discussed, surgical consent, monitors and equipment checked, pre-op evaluation and timeout performed  Epidural Patient position: sitting Prep: DuraPrep and site prepped and draped Patient monitoring: continuous pulse ox and blood pressure Approach: midline Location: L3-L4 Injection technique: LOR air  Needle:  Needle type: Tuohy  Needle gauge: 17 G Needle length: 9 cm and 9 Needle insertion depth: 6 cm Catheter type: closed end flexible Catheter size: 19 Gauge Catheter at skin depth: 11 cm Test dose: negative and Other  Assessment Events: blood not aspirated, injection not painful, no injection resistance, no paresthesia and negative IV test  Additional Notes Reason for block:procedure for pain

## 2020-09-28 NOTE — Op Note (Addendum)
Patient: Michelle Reid  DOB: 19-Jan-1981 MRN: [id number]    DATE OF SURGERY:09/28/2020   PREOP DIAGNOSIS:  1. Cord prolapse with prolonged fetal decelerations after spontaneous rupture of membranes.  2. Induction of labor at 37 weeks 3 days EGA for separation of amnion noted on ultrasound.  3. Single umbilical artery. 4. Velamentous cord insertion. 5. 8 cm simple right ovarian cyst.   6.  Advanced maternal age.   POSTOP DIAGNOSIS: Same as above and  6. Bicornuate uterus.   PROCEDURE:  1. Emergery Primary low uterine segment transverse cesarean section via Pfannenstiel incision.    2. Right ovarian cystectomy.   SURGEON: Case Started by Dr. Leroy Libman (see her op note) and finished by me Dr.  Angus Palms Sallye Ober  ASSISTANT:  Arlan Organ, CNM  ANESTHESIA: Bolused epidural  COMPLICATIONS: None  FINDINGS: Viable female infant in cephalic presentation, weight 5 lbs 8.9 oz, Apgar scores of 2, 3, 4.  Bicornuate  Uterus.  Right ovarian cyst attached to right ovary and right fallopian tube. Normal left and right ovaries.  Normal left fallopian tube. Part of right fallopian tube attached to right ovarian cyst.     EBL:  154 cc  IV FLUID:  1L cc LR   URINE OUTPUT: 200 cc clear urine  INDICATIONS:  39 y/o G3P0020 P0 who presented for induction of labor at 37 weeks 3 days EGA for amnion separation noted on ultrasound.  She had a normal category 1 tracing on presentation and during most of the induction.  She progressed with oral cytotec, foley balloon and pitocin induction of labor. She then spontaneously ruptured and on exam cord prolapse as well as fetal head and extremity were noted on exam.  Fetal deceleration was also noted at that time.  An emergency cesarean section was called for and started by Dr. Earlene Plater, Tresa Endo, please see her op note.   PROCEDURE:  I arrived to find the baby delivered and uterus closed by Dr. Earlene Plater and assistants at which point I took over.  Attention was turned to right  adnexa where the right ovarian cyst was found.  Verbal consent was obtained from the patient to proceed with the ovarian cystectomy.  The cyst was elevated out of the pelvis and placed on a towel. A picture of the ovarian cyst and adnexa was taken.  Normal saline was injected for hydro dissection into the cyst wall.  The cyst was incised and the cyst capsule removed from the underlying ovarian capsule and mesosalpinx, carefully dissecting the fallopian tube away where it was attached to the cyst.  Sharp dissection and blunt dissection with the finger and gauze pad was used to remove the cyst in its entirety, keeping it intact with no spillage.  The base of the cyst attachment was tied off with 1-0 plain suture after double clamping the pedicle.  Excellent hemostasis was noted over the ovarian attachment site and right adnexa. The fallopian tube was felt to be grossly intact except for small excess tissue on its external surface left after the removal of the cyst which was hemostatic.   Intercede was placed over the right adnexa and area of cyst resection .  Attention was turned back to cesarean incision and it was noted to have excellent hemostasis.  Alexis retractor was removed.  The muscles and peritoneum were then reapproximated using chromic suture.  Fascia was closed using 0 Vicryl in a running stitch from both ends. The subcutaneous layer was irrigated and suctioned out. Small perforators were  contained with the bovie.  The subcutaneous layer was closed using 1-0 plain in interrupted stitches. The skin was closed using 4-0 vicryl on the keith needle. Benzocaine and steri strips were applied.  Honeycomb was then applied. The patient was then cleaned and she was taken to the recovery room in stable conditions.  Her baby was taken to NICU by NICU team.  All counts were correct as per OR protocols.     SPECIMENS: Placenta to pathology, umbilical cord blood to lab.  Umbilical cord blood pH (Call from lab while in  OR relayed results of umbilical cord arterial pH of 7.2).    DISPOSITION: TO PACU, STABLE.   Dr. Sallye Ober.   09/28/2020.  11.20 pm.

## 2020-09-28 NOTE — Progress Notes (Signed)
Michelle Reid is a 39 y.o. G3P0020 at [redacted]w[redacted]d by ultrasound admitted for IOL 2/2 amnion separation, VCI, SUA, SGA S/P one dose buccal cytotec and IF RN reports cervical balloon expelled.   Subjective: Resting in bed in lateral position, partner at [facility] and supportive. Reports ctx a bit lighter since balloon out but still painful. One episode emesis after IV fentanyl and promethazine for pain.   Objective: Vitals:   09/28/20 1358 09/28/20 1436 09/28/20 1527 09/28/20 1628  BP: 123/68  111/74 134/81  Pulse: 61  71 76  Resp: 18 18    Temp:   98.2 F (36.8 C)   TempSrc:   Oral   Weight:      Height:          FHT:  FHR: 130 bpm, variability: moderate,  accelerations:  Present,  decelerations:  Absent UC:   regular, every 2-3 minutes SVE:   Dilation: 5 Effacement (%): 70 Station: Ballotable Exam by:: Arlan Organ, cnm Cephalic presentation verified w/ BS sono Compound presentation with finger noted adjacent to head OP presentation. BBOW  Labs:   Recent Labs    09/28/20 1251  WBC 10.5  HGB 11.9*  HCT 35.1*  PLT 195   GBS positive per PCR  Assessment / Plan: [id number] 39 y.o. [redacted]w[redacted]d Induction of labor due to VCI/SUA with amnion separation,  progressing well on cytotec x 1 dose and IF  Labor: anticiapte physiologic labor, plan slow release AROM when comfortable w/ epidural Preeclampsia:  no signs or symptoms of toxicity and intake and ouput balanced Fetal Wellbeing:  Category I Pain Control:  prep for epidural I/D:  GBS positive, will give ancef protocol d/t Amoxicillin allergy (rash) Anticipated MOD:  NSVD  Neta Mends, CNM, MSN 09/28/2020, 6:32 PM

## 2020-09-28 NOTE — Anesthesia Preprocedure Evaluation (Signed)
Anesthesia Evaluation  Patient identified by MRN, date of birth, ID band Patient awake    Reviewed: Allergy & Precautions, H&P , NPO status , Patient's Chart, lab work & pertinent test results  Airway Mallampati: I       Dental no notable dental hx.    Pulmonary neg pulmonary ROS, former smoker,    Pulmonary exam normal        Cardiovascular negative cardio ROS Normal cardiovascular exam     Neuro/Psych negative neurological ROS  negative psych ROS   GI/Hepatic negative GI ROS, Neg liver ROS,   Endo/Other  negative endocrine ROS  Renal/GU negative Renal ROS  negative genitourinary   Musculoskeletal negative musculoskeletal ROS (+)   Abdominal Normal abdominal exam  (+)   Peds  Hematology negative hematology ROS (+)   Anesthesia Other Findings   Reproductive/Obstetrics (+) Pregnancy                             Anesthesia Physical Anesthesia Plan  ASA: II  Anesthesia Plan: Epidural   Post-op Pain Management:    Induction:   PONV Risk Score and Plan:   Airway Management Planned:   Additional Equipment:   Intra-op Plan:   Post-operative Plan:   Informed Consent: I have reviewed the patients History and Physical, chart, labs and discussed the procedure including the risks, benefits and alternatives for the proposed anesthesia with the patient or authorized representative who has indicated his/her understanding and acceptance.       Plan Discussed with:   Anesthesia Plan Comments:         Anesthesia Quick Evaluation

## 2020-09-28 NOTE — Progress Notes (Signed)
Faculty Note  Called to room for prolonged decel. On my arrival, patient was on hands and knees with fetal heart rate in 60s. CNM Renae Fickle was there, stated patient had SROM and then had decel. On exam, fetal head, cord and fetal hand palpable. Given cord prolapse, stat c-section called. Patient informed with cord prolapse, recommended proceeding with emergent c-section, she was agreeable. Patient taken to OR for emergent c-section. See op note for details.   Baldemar Lenis, M.D. Attending Center for [employer] Midwife)

## 2020-09-29 ENCOUNTER — Encounter (HOSPITAL_COMMUNITY): Payer: Self-pay | Admitting: Obstetrics & Gynecology

## 2020-09-29 LAB — RPR: RPR Ser Ql: NONREACTIVE

## 2020-09-29 LAB — CBC
HCT: 33.6 % — ABNORMAL LOW (ref 36.0–46.0)
Hemoglobin: 11.4 g/dL — ABNORMAL LOW (ref 12.0–15.0)
MCH: 31.6 pg (ref 26.0–34.0)
MCHC: 33.9 g/dL (ref 30.0–36.0)
MCV: 93.1 fL (ref 80.0–100.0)
Platelets: 179 10*3/uL (ref 150–400)
RBC: 3.61 MIL/uL — ABNORMAL LOW (ref 3.87–5.11)
RDW: 13.1 % (ref 11.5–15.5)
WBC: 23.2 10*3/uL — ABNORMAL HIGH (ref 4.0–10.5)
nRBC: 0 % (ref 0.0–0.2)

## 2020-09-29 MED ORDER — NALBUPHINE HCL 10 MG/ML IJ SOLN: 5.0000 mg | Freq: Once | INTRAMUSCULAR | Status: DC | PRN

## 2020-09-29 MED ORDER — DIPHENHYDRAMINE HCL 25 MG PO CAPS: 25.0000 mg | ORAL_CAPSULE | ORAL | Status: DC | PRN

## 2020-09-29 MED ORDER — COCONUT OIL OIL
1.0000 "application " | TOPICAL_OIL | Status: DC | PRN
  Administered 2020-09-29: 1 via TOPICAL

## 2020-09-29 MED ORDER — ACETAMINOPHEN 500 MG PO TABS
1000.0000 mg | ORAL_TABLET | Freq: Four times a day (QID) | ORAL | Status: DC
  Administered 2020-09-29 – 2020-10-01 (×9): 1000 mg via ORAL
  Filled 2020-09-29 (×10): qty 2

## 2020-09-29 MED ORDER — OXYTOCIN-SODIUM CHLORIDE 30-0.9 UT/500ML-% IV SOLN
2.5000 [IU]/h | INTRAVENOUS | Status: AC
  Administered 2020-09-29: 2.5 [IU]/h via INTRAVENOUS
  Filled 2020-09-29: qty 500

## 2020-09-29 MED ORDER — HYDROMORPHONE HCL 2 MG PO TABS: 2.0000 mg | ORAL_TABLET | ORAL | Status: DC | PRN

## 2020-09-29 MED ORDER — LACTATED RINGERS IV SOLN: INTRAVENOUS | Status: DC

## 2020-09-29 MED ORDER — IBUPROFEN 800 MG PO TABS
800.0000 mg | ORAL_TABLET | Freq: Four times a day (QID) | ORAL | Status: DC
  Administered 2020-09-29 – 2020-10-01 (×7): 800 mg via ORAL
  Filled 2020-09-29 (×7): qty 1

## 2020-09-29 MED ORDER — SODIUM CHLORIDE 0.9% FLUSH: 3.0000 mL | INTRAVENOUS | Status: DC | PRN

## 2020-09-29 MED ORDER — OXYCODONE HCL 5 MG PO TABS: 5.0000 mg | ORAL_TABLET | ORAL | Status: DC | PRN

## 2020-09-29 MED ORDER — NALOXONE HCL 4 MG/10ML IJ SOLN
1.0000 ug/kg/h | INTRAVENOUS | Status: DC | PRN
  Filled 2020-09-29: qty 5

## 2020-09-29 MED ORDER — SCOPOLAMINE 1 MG/3DAYS TD PT72
MEDICATED_PATCH | TRANSDERMAL | Status: AC
  Filled 2020-09-29: qty 1

## 2020-09-29 MED ORDER — NALBUPHINE HCL 10 MG/ML IJ SOLN: 5.0000 mg | INTRAMUSCULAR | Status: DC | PRN

## 2020-09-29 MED ORDER — ZOLPIDEM TARTRATE 5 MG PO TABS: 5.0000 mg | ORAL_TABLET | Freq: Every evening | ORAL | Status: DC | PRN

## 2020-09-29 MED ORDER — DIBUCAINE (PERIANAL) 1 % EX OINT: 1.0000 "application " | TOPICAL_OINTMENT | CUTANEOUS | Status: DC | PRN

## 2020-09-29 MED ORDER — NALOXONE HCL 0.4 MG/ML IJ SOLN: 0.4000 mg | INTRAMUSCULAR | Status: DC | PRN

## 2020-09-29 MED ORDER — WITCH HAZEL-GLYCERIN EX PADS: 1.0000 "application " | MEDICATED_PAD | CUTANEOUS | Status: DC | PRN

## 2020-09-29 MED ORDER — SCOPOLAMINE 1 MG/3DAYS TD PT72
1.0000 | MEDICATED_PATCH | Freq: Once | TRANSDERMAL | Status: DC
  Administered 2020-09-29: 1.5 mg via TRANSDERMAL

## 2020-09-29 MED ORDER — PRENATAL MULTIVITAMIN CH
1.0000 | ORAL_TABLET | Freq: Every day | ORAL | Status: DC
  Administered 2020-09-29 – 2020-10-01 (×3): 1 via ORAL
  Filled 2020-09-29 (×3): qty 1

## 2020-09-29 MED ORDER — ONDANSETRON HCL 4 MG/2ML IJ SOLN: 4.0000 mg | Freq: Three times a day (TID) | INTRAMUSCULAR | Status: DC | PRN

## 2020-09-29 MED ORDER — DIPHENHYDRAMINE HCL 25 MG PO CAPS: 25.0000 mg | ORAL_CAPSULE | Freq: Four times a day (QID) | ORAL | Status: DC | PRN

## 2020-09-29 MED ORDER — ACETAMINOPHEN 500 MG PO TABS: 1000.0000 mg | ORAL_TABLET | Freq: Four times a day (QID) | ORAL | Status: DC

## 2020-09-29 MED ORDER — SIMETHICONE 80 MG PO CHEW: 80.0000 mg | CHEWABLE_TABLET | ORAL | Status: DC | PRN

## 2020-09-29 MED ORDER — MENTHOL 3 MG MT LOZG: 1.0000 | LOZENGE | OROMUCOSAL | Status: DC | PRN

## 2020-09-29 MED ORDER — SENNOSIDES-DOCUSATE SODIUM 8.6-50 MG PO TABS
2.0000 | ORAL_TABLET | ORAL | Status: DC
  Administered 2020-09-29 – 2020-10-01 (×3): 2 via ORAL
  Filled 2020-09-29 (×3): qty 2

## 2020-09-29 MED ORDER — KETOROLAC TROMETHAMINE 30 MG/ML IJ SOLN
30.0000 mg | Freq: Four times a day (QID) | INTRAMUSCULAR | Status: AC
  Administered 2020-09-29 (×3): 30 mg via INTRAVENOUS
  Filled 2020-09-29 (×3): qty 1

## 2020-09-29 MED ORDER — DIPHENHYDRAMINE HCL 50 MG/ML IJ SOLN: 12.5000 mg | INTRAMUSCULAR | Status: DC | PRN

## 2020-09-29 NOTE — Progress Notes (Signed)
Subjective: POD# 1 Information for the patient's newborn:  Michelle Reid, Michelle Reid [[id number]]  female    Baby's Name Araia Circumcision N/A  Reports feeling good Feeding: breast. pumping Reports tolerating PO and denies N/V, foley removed, ambulating and urinating w/o difficulty  Pain controlled with PO meds Denies HA/SOB/dizziness  Flatus not passing Vaginal bleeding is normal, no clots     Objective:  VS:  Vitals:   09/29/20 0540 09/29/20 0601 09/29/20 0750 09/29/20 1203  BP:  105/63 106/61 105/63  Pulse:  60 63 66  Resp:  18 18 18   Temp:  97.7 F (36.5 C) 98.5 F (36.9 C) 97.7 F (36.5 C)  TempSrc:  Oral Oral Oral  SpO2: 99% 97% 93% 97%  Weight:      Height:        Intake/Output Summary (Last 24 hours) at 09/29/2020 1249 Last data filed at 09/29/2020 1020 Gross per 24 hour  Intake 3569 ml  Output 968 ml  Net 2601 ml     Recent Labs    09/28/20 1251 09/29/20 0402  WBC 10.5 23.2*  HGB 11.9* 11.4*  HCT 35.1* 33.6*  PLT 195 179    Blood type: --/--/O POS (11/12 1235) Rubella: Immune (11/12 0000)    Physical Exam:  General: alert, cooperative and no distress CV: Regular rate and rhythm Resp: clear Abdomen: soft, nontender, normal bowel sounds Incision: clean, dry and intact Uterine Fundus: firm, below umbilicus, nontender Lochia: appropriate Ext: trace edema, no pain, tenderness, or cords   Assessment/Plan: 39 y.o.   POD# 1. 41                  Principal Problem:   Postpartum care following cesarean delivery 11/12 Active Problems:   Indication for care in labor or delivery   Delivery by emergency cesarean - cord prolapse   Routine post-op PP care          Advance diet as tolerated Advised warm fluids and ambulation to improve GI motility Encourage rest when baby rests Breastfeeding support Anticipate D/C 09/30/20   10/02/20, MSN, CNM 09/29/2020, 12:49 PM

## 2020-09-29 NOTE — Lactation Note (Signed)
This note was copied from a baby's chart. Lactation Consultation Note  Patient Name: Michelle Reid RCBUL'A Date: 09/29/2020 Reason for consult: Initial assessment;NICU baby  LC to infant's room for initial visit with mother. Per mom, RN set up pump in her room and reviewed cleaning/storage. This LC reinforced earlier learning. This LC taught HE and encouraged mom to pump q 2-3 hours for breast stimulation. Explained that HE may produce drops of colostrum today if pumping does not. This mother has a Medela pump at home for use p d/c. She denies hx of breast surgery/trauma. Patient was provided with the opportunity to ask questions. All concerns were addressed. Will plan follow up visit.   Interventions Interventions: Expressed milk;Hand express;DEBP  Lactation Tools Discussed/Used Pump Review: Setup, frequency, and cleaning;Milk Storage Initiated by:: RN Date initiated:: 09/28/20   Consult Status Consult Status: Follow-up Date: 09/30/20 Follow-up type: In-patient    Elder Negus 09/29/2020, 11:43 AM

## 2020-09-29 NOTE — Plan of Care (Signed)
Post surgical plan of care discussed with patient pumping,plan to get out of bed and foley removal.Orientation to room and routine done.

## 2020-09-29 NOTE — Lactation Note (Signed)
This note was copied from a baby's chart. Lactation Consultation Note  Patient Name: Michelle Reid LKGMW'N Date: 09/29/2020 Reason for consult: Follow-up assessment;Mother's request;NICU baby  Returned briefly to assist with pumping. No charge.  Consult Status Consult Status: Follow-up Date: 09/30/20 Follow-up type: In-patient    Elder Negus 09/29/2020, 1:04 PM

## 2020-09-29 NOTE — Anesthesia Postprocedure Evaluation (Signed)
Anesthesia Post Note  Patient: Michelle Reid  Procedure(s) Performed: CESAREAN SECTION (N/A )     Patient location during evaluation: PACU Anesthesia Type: Epidural Level of consciousness: awake Pain management: pain level controlled Vital Signs Assessment: post-procedure vital signs reviewed and stable Respiratory status: spontaneous breathing Cardiovascular status: stable Postop Assessment: no headache, no backache, epidural receding, patient able to bend at knees and no apparent nausea or vomiting Anesthetic complications: no   No complications documented.  Last Vitals:  Vitals:   09/29/20 0057 09/29/20 0121  BP: 114/65 123/67  Pulse: 73 65  Resp: 17 16  Temp: 36.9 C 36.8 C  SpO2: 96% 94%    Last Pain:  Vitals:   09/29/20 0121  TempSrc: Oral  PainSc:    Pain Goal:                   Caren Macadam

## 2020-09-29 NOTE — Progress Notes (Signed)
Breast care and how to use breast pump,clean pump,store and label milk explained and demonstrated to patient.Patient then pumped for 15 minutes.Patient then demonstrated back to me how to put pump together and take it apart.

## 2020-09-30 NOTE — Lactation Note (Signed)
This note was copied from a baby's chart. Lactation Consultation Note  Patient Name: Michelle Reid EMLJQ'G Date: 09/30/2020   [facility] was requested to attend the 1400 latch for infant's first breastfeed.  When LC arrived, family was not in the room.  LC notified RN.  LC will visit family tomorrow.    Maternal Data    Feeding    LATCH Score                   Interventions    Lactation Tools Discussed/Used     Consult Status      Maryruth Hancock [facility] 09/30/2020, 2:55 PM

## 2020-09-30 NOTE — Progress Notes (Signed)
Subjective: Postpartum Day 1 LTCS for cord prolapse,   Patient up ad lib, reports no syncope or dizziness. Feeding:  Breast but infant in NICU   Objective: Vital signs in last 24 hours: Temp:  [97.8 F (36.6 C)-98.3 F (36.8 C)] 98.3 F (36.8 C) (11/14 1103) Pulse Rate:  [54-73] 62 (11/14 1103) Resp:  [14-18] 16 (11/14 1103) BP: (87-106)/(48-68) 102/62 (11/14 1103) SpO2:  [95 %-98 %] 95 % (11/14 1103)  Physical Exam:  General: alert, cooperative and no distress Lochia: appropriate Uterine Fundus: firm Incision covered DVT Evaluation: Negative Homan's sign, no signs of DVT   CBC Latest Ref Rng & Units 09/29/2020 09/28/2020  WBC 4.0 - 10.5 K/uL 23.2(H) 10.5  Hemoglobin 12.0 - 15.0 g/dL 11.4(L) 11.9(L)  Hematocrit 36 - 46 % 33.6(L) 35.1(L)  Platelets 150 - 400 K/uL 179 195     Assessment/Plan: Status post cesarean delivery day 1 Stable Continue current care. Continue postpartum care.    Henderson Newcomer ProtheroCNM 09/30/2020, 1:12 PM

## 2020-09-30 NOTE — Lactation Note (Signed)
This note was copied from a baby's chart. Lactation Consultation Note  Patient Name: Michelle Reid YJEHU'D Date: 09/30/2020 Reason for consult: Follow-up assessment;NICU baby;Early term 37-38.6wks   Mom resting in bed.  Was pumping but stopped when LC entered.  Mom states she is not getting anything out but is understands consistency of pumping is important.  She denies discomfort with pumping and does not have any questions regarding pumping.  LC reviewed frequency of pumping and encouraged mom to hand express after pumping in order to try to collect EBM to give to NICU baby.  Information provided about continued lactation support while infant is in NICU.  Mom is aware there is a pump in infant's room she can use while with infant.  Mom was encouraged to call out for any questions or needs regarding pumping.    Maternal Data    Feeding    LATCH Score                   Interventions Interventions:  (discussed pumping frequency and hand expression afterwards)  Lactation Tools Discussed/Used     Consult Status Consult Status: Follow-up Date: 10/01/20 Follow-up type: In-patient    Maryruth Hancock [facility] 09/30/2020, 8:50 AM

## 2020-10-01 ENCOUNTER — Ambulatory Visit: Payer: Self-pay

## 2020-10-01 MED ORDER — OXYCODONE HCL 5 MG PO TABS: 5.0000 mg | ORAL_TABLET | ORAL | 0 refills | Status: AC | PRN

## 2020-10-01 MED ORDER — IBUPROFEN 800 MG PO TABS: 800.0000 mg | ORAL_TABLET | Freq: Four times a day (QID) | ORAL | 0 refills | Status: AC

## 2020-10-01 NOTE — Lactation Note (Signed)
This note was copied from a baby's chart. Lactation Consultation Note  Patient Name: Girl Cary Wilford BVAPO'L Date: 10/01/2020 Reason for consult: Follow-up assessment   LC Follow Up Visit:  Visited with mother in her room on first floor this morning.  She is continuing to pump approximately every three hours and is now starting to see drops of EBM.  Reminded her to be diligent about breast massage, hand expression before/after pumping and to pump consistently.  Mother is interested in trying to latch her daughter to the breast today in NICU.  She will discuss with RN and I suggested she have her NICU RN contact me for this consult.    Mother is familiar with pumping in the NICU.  I will assist with latching when baby is ready to breast feed later today.   Maternal Data    Feeding Feeding Type: Donor Breast Milk  LATCH Score                   Interventions    Lactation Tools Discussed/Used     Consult Status Consult Status: Follow-up Date: 10/02/20 Follow-up type: In-patient    Taro Hidrogo R Kamar Callender 10/01/2020, 8:12 AM

## 2020-10-01 NOTE — Lactation Note (Signed)
This note was copied from a baby's chart. Lactation Consultation Note  Patient Name: Michelle Reid [id number] Date: 10/01/2020 Reason for consult: Follow-up assessment   LC Follow Up Visit:  Mother requested lactation; she had a couple of questions regarding her pumping.  Mother has started to obtain a few drops of colostrum.  She was interested in learning how to best save her drops.  Reviewed how to collect and store drops.  Put a drop on baby's lips while he was doing STS with mother.  Mother has been pumping every three hours.  Father present.   Maternal Data    Feeding Feeding Type: Donor Breast Milk  LATCH Score                   Interventions    Lactation Tools Discussed/Used     Consult Status Consult Status: Follow-up Date: 10/02/20 Follow-up type: In-patient    Amber Williard R Pavle Wiler 10/01/2020, 3:50 PM

## 2020-10-01 NOTE — Lactation Note (Signed)
This note was copied from a baby's chart. Lactation Consultation Note  Patient Name: Michelle Reid VOHYW'V Date: 10/01/2020    [facility] Follow Up Visit:  Visited with mother in the NICU.  Mother was interested in attempting breast feeding today.    Baby was awake when I arrived.  Assessed her suck to be slightly uncoordinated and performed suck training.  With cheek support she began sucking stronger on my gloved finger.  Provided approximately 3.5 mls os EBM via curved tip syringe while working with her coordination.  Baby consumed this without difficulty.  Assisted to latch in the football hold on the right breast easily.  However, once at the breast, baby became sleepy and did not desire to suck.  Demonstrated breast compressions and gentle stimulation but she was not interested.  Placed her STS and she fell asleep on mother's chest.  RN in to room to initiate the gavage feeding.  Reviewed breast feeding basics with mother.  Observed mother pumping and the #27 flanges are appropriate today.  It appears that she may need a #30 with further pumping.  Educated mother on how to find the appropriate size flange for pumping.  Provided the #30 flanges so mother can use them as needed.  Mother appreciative.  Reviewed pump settings and demonstrated how to use the manual pump; set up the washing station for mother.  Explained how she can make a NICU appointment with her RN for further consults as needed.  Father present and resting on the couch.  RN in room and updated.   Maternal Data    Feeding Feeding Type: Donor Breast Milk  LATCH Score                   Interventions    Lactation Tools Discussed/Used     Consult Status      Michelle Reid 10/01/2020, 2:51 PM

## 2020-10-01 NOTE — Discharge Summary (Addendum)
OB Discharge Summary  Patient Name: Michelle Reid DOB: 11/04/1981 MRN: [id number]  Date of admission: 09/28/2020 Intrauterine pregnancy: [redacted]w[redacted]d   Admitting diagnosis: Indication for care in labor or delivery [O75.9] Secondary diagnosis:  Patient Active Problem List   Diagnosis Date Noted  . Indication for care in labor or delivery 09/28/2020  . Postpartum care following cesarean delivery 11/12 09/28/2020  . Delivery by emergency cesarean - cord prolapse 09/28/2020     Date of discharge: 10/01/2020    Discharge diagnosis: Term Pregnancy Delivered     Prenatal history: [id number]   EDC : 10/16/2020, by Last Menstrual Period  [facility]atal [facility] at [facility]  Primary provider : [facility] Prenatal course complicated by Velamentous cord insertion, 2 vessel cord, amnion separation, mild polyhydramnios, AMA  Prenatal Labs: ABO, Rh: --/--/O POS (11/12 1235) /  Antibody: NEG (11/12 1235) Rubella: Immune (11/12 0000)  /  RPR: NON REACTIVE (11/12 1251)  HBsAg: Negative (11/12 0000)  HIV: Non-reactive (11/12 0000)  GBS: POSITIVE/-- (11/12 1342)                                    Hospital course:  Induction of Labor With Cesarean Section   39 y.o. yo [id number] at [redacted]w[redacted]d was admitted to the hospital 09/28/2020 for induction of labor. Patient had a labor course significant for IOL - cytotec, foley, SROM with cord prolapse. The patient went for cesarean section due to Cord Prolapse. Delivery details are as follows: Membrane Rupture Time/Date: 9:04 PM ,09/28/2020   Delivery Method:C-Section, Low Transverse  Details of operation can be found in separate operative Note.  Patient had an uncomplicated postpartum course. She is ambulating, tolerating a regular diet, passing flatus, and urinating well.  Patient is discharged home in stable condition on 10/01/20.      Newborn Data: Birth date:09/28/2020  Birth time:9:23 PM  Gender:Female  Living status:Living  Apgars:2 ,3  Weight:2520 g                                 Delivering PROVIDER: Leroy Libman M                                                            Complications: None Newborn Data: Live born female  Birth Weight: 5 lb 8.9 oz (2520 g) APGAR: 2, 3  Newborn Delivery   Birth date/time: 09/28/2020 21:23:00 Delivery type: C-Section, Low Transverse C-section categorization: Primary      Baby Feeding: Breast Disposition:NICU  Post partum procedures:none  Labs: Lab Results  Component Value Date   WBC 23.2 (H) 09/29/2020   HGB 11.4 (L) 09/29/2020   HCT 33.6 (L) 09/29/2020   MCV 93.1 09/29/2020   PLT 179 09/29/2020   No flowsheet data found.  Physical Exam @ time of discharge:  Vitals:   09/30/20 1955 09/30/20 2338 10/01/20 0550 10/01/20 0803  BP: 112/73 102/67 111/68 118/71  Pulse: 60 (!) 55 (!) 52 (!) 55  Resp: 16 14 16 16   Temp: 98.3 F (36.8 C) 98.3 F (36.8 C) 98.3 F (36.8 C) 97.6 F (36.4 C)  TempSrc: Oral Oral Oral Oral  SpO2: 100% 98%  95% 97%  Weight:      Height:       general: alert lochia: appropriate uterine fundus: firm perineum: intact incision: dsg CDI Extremities: WNL DVT Evaluation: No evidence of DVT seen on physical exam.  Discharge instructions:  "Baby and Me Booklet" and [facility] Booklet Discharge Medications: PNV, oxycodone, tylenol, ibuprofen Allergies as of 10/01/2020      Reactions   Amoxicillin Rash      Medication List    TAKE these medications   FISH OIL PO Take by mouth.   FOLIC ACID PO Take by mouth.   ibuprofen 800 MG tablet Commonly known as: ADVIL Take 1 tablet (800 mg total) by mouth every 6 (six) hours.   IRON PO Take by mouth.   oxyCODONE 5 MG immediate release tablet Commonly known as: Oxy IR/ROXICODONE Take 1 tablet (5 mg total) by mouth every 4 (four) hours as needed for moderate pain.   PRENATAL VITAMIN PO Take by mouth.   VITAMIN A PO Take by mouth.   VITAMIN D PO Take by mouth.   VITAMIN E PO Take by mouth.            Discharge  Care Instructions  (From admission, onward)         Start     Ordered   10/01/20 0000  Discharge wound care:       Comments: Take dressing off on day 5-7 postpartum.  Report increased drainage, redness or warmth. Clean with water, let soap trickle down body. Can leave steri strips on until they fall off or take them off gently at day 10. Keep open to air, clean and dry.   10/01/20 1134         Diet: routine diet Activity: Advance as tolerated. Pelvic rest x 6 weeks.  Follow up:6 weeks    Follow-up Information    [facility]. Schedule an appointment as soon as possible for a visit in 6 week(s).   Specialty: Obstetrics and Gynecology Contact information: [address] [place] [address] [phone]              Signed: Carollee Leitz MSN, CNM 10/01/2020, 1:05 PM

## 2020-10-01 NOTE — Progress Notes (Signed)
Patient screened out for psychosocial assessment since none of the following apply:  Psychosocial stressors documented in mother or baby's chart  Gestation less than 32 weeks  Code at delivery   Infant with anomalies Please contact the Clinical Social Worker if specific needs arise, by MOB's request, or if MOB scores greater than 9/yes to question 10 on Edinburgh Postpartum Depression Screen.  Cove Haydon, LCSW Clinical Social Worker [facility] Cell#: [phone]     

## 2020-10-02 ENCOUNTER — Ambulatory Visit: Payer: Self-pay

## 2020-10-02 LAB — SURGICAL PATHOLOGY

## 2020-10-02 NOTE — Lactation Note (Signed)
This note was copied from a baby's chart. Lactation Consultation Note  Patient Name: Michelle Reid IYJGZ'Q Date: 10/02/2020 Reason for consult: Follow-up assessment;NICU baby  LC to room for f/u visit. Mom pumping at baby's bedside. She denies engorgement and continues to pump q 3 hours. Patient was provided with the opportunity to ask questions. All concerns were addressed.  LC provided additional labels and milk storage containers.    Consult Status Consult Status: Follow-up Date: 10/03/20 Follow-up type: In-patient    Elder Negus 10/02/2020, 12:44 PM

## 2020-10-05 ENCOUNTER — Ambulatory Visit: Payer: Self-pay

## 2020-10-05 NOTE — Lactation Note (Signed)
This note was copied from a baby's chart. Lactation Consultation Note  Patient Name: Michelle Reid CWCBJ'S Date: 10/05/2020 Reason for consult: Follow-up assessment;Primapara;1st time breastfeeding;NICU baby;Early term 37-38.6wks   RN requested assistance for Mom.  LC in to assist/assess P1 Mom with positioning and latch.  Baby 32 days old and AGA [redacted]w[redacted]d.    Mom has been consistently pumping every 3 hrs, and obtaining 60-90 per pumping.    Breasts full and baby positioned in cross cradle hold to try to latch.  Due to breast fullness, assisted Mom to pre-pump to soften breast.  After a couple attempts, initiated a 20 mm nipple shield showing Mom how to invert shield prior to placing on nipple.  With NS, baby able to attain a deeper latch with consistent sucking and swallowing noted.  Mom taught to compress breast during sucking bursts.  Deep jaw extensions noted with swallows.    Baby fed well for 10 mins before falling asleep.  Mom placed baby STS on her chest to burp and baby appeared contented.  RN aware of 10 minute feeding.  Mom very pleased with feeding.  Encouraged partial pre-pumping to avoid a full breast, and to make latch easier for baby.   Mom aware of lactation support and encouraged to ask for help prn.  Feeding Feeding Type: Breast Fed  LATCH Score Latch: Repeated attempts needed to sustain latch, nipple held in mouth throughout feeding, stimulation needed to elicit sucking reflex.  Audible Swallowing: Spontaneous and intermittent  Type of Nipple: Everted at rest and after stimulation  Comfort (Breast/Nipple): Soft / non-tender  Hold (Positioning): Assistance needed to correctly position infant at breast and maintain latch.  LATCH Score: 8  Interventions Interventions: Breast feeding basics reviewed;Assisted with latch;Skin to skin;Breast massage;Hand express;Breast compression;Adjust position;Support pillows;Position options;Expressed milk;DEBP  Lactation  Tools Discussed/Used Tools: Pump;Bottle;Flanges;Nipple Shields Nipple shield size: 20 Flange Size: 24 Breast pump type: Double-Electric Breast Pump   Consult Status Consult Status: Follow-up Date: 10/08/20 Follow-up type: In-patient    Judee Clara 10/05/2020, 12:57 PM

## 2020-10-07 ENCOUNTER — Ambulatory Visit: Payer: Self-pay

## 2020-10-07 NOTE — Lactation Note (Signed)
This note was copied from a baby's chart. Lactation Consultation Note  Patient Name: Michelle Reid LKJZP'H Date: 10/07/2020    [facility] Follow Up Visit:  RN requested a follow up visit with mother regarding NS.  She stated that mother desires a #30 NS.  Informed RN we don't have that size and went in to visit with mother.    After further questioning, I determined that mother was referring to her flange size being #30, not the NS.  Mother feels like the #24 NS may be too small.  Reviewed NS placement and, without the baby latching and sucking, it appears that the #24 is fine without stimulation.  It may be too small with latching and sucking.  Suggested mother call for a lactation visit when she is ready to latch and feed baby at the breast again.  Baby will not be latching any more tonight.  Mother verbalized understanding and will have a follow up visit.   Maternal Data    Feeding Feeding Type: Breast Milk Nipple Type: Dr. Cline Crock  LATCH Score                   Interventions    Lactation Tools Discussed/Used     Consult Status Consult Status: Follow-up Date: 10/08/20 Follow-up type: In-patient    Antwane Grose R Merrianne Mccumbers 10/07/2020, 6:18 PM

## 2020-10-09 ENCOUNTER — Ambulatory Visit: Payer: Self-pay

## 2020-10-09 NOTE — Lactation Note (Signed)
This note was copied from a baby's chart. Lactation Consultation Note  Patient Name: Michelle Reid [id number]'E Date: 10/09/2020 Reason for consult: Follow-up assessment;NICU baby  LC to room for f/u visit. Baby for possible d/c today. Baby bf with audible swallowing during visit. Mom notices breast softness with feedings and denies pain or difficulty. This LC reviewed bf and output norms for age. Encouraged continued care with Ped and WIC. Mother is aware of community resources. Will plan f/u visit prn.   Feeding Feeding Type: Breast Fed Nipple Type: Dr. Levert Feinstein Preemie  Interventions Interventions: Breast feeding basics reviewed;Position options;Expressed milk;Skin to skin;DEBP   Consult Status Consult Status: Follow-up Date: 10/10/20 Follow-up type: In-patient    Elder Negus 10/09/2020, 11:00 AM

## 2021-12-09 IMAGING — US US MFM OB DETAIL+14 WK
1 series · 12 of 28 positions shown · non-contrast
Comparison: none

[Series 1: us mfm ob detail+14 wk · 12 of 112 slices shown]
[im 5/112]
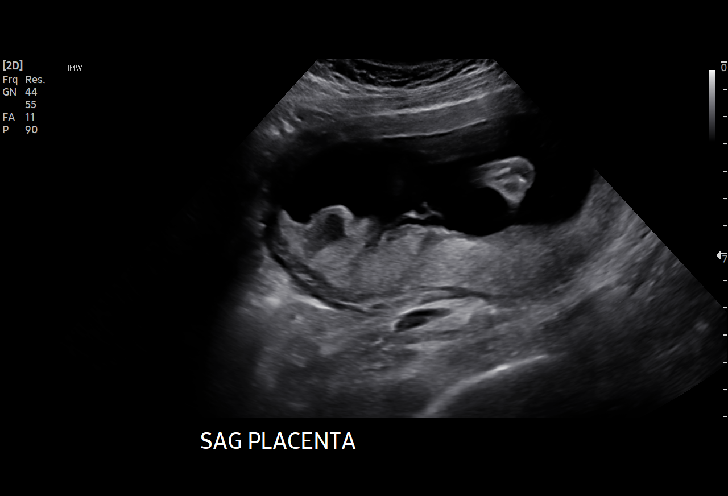
[im 13/112]
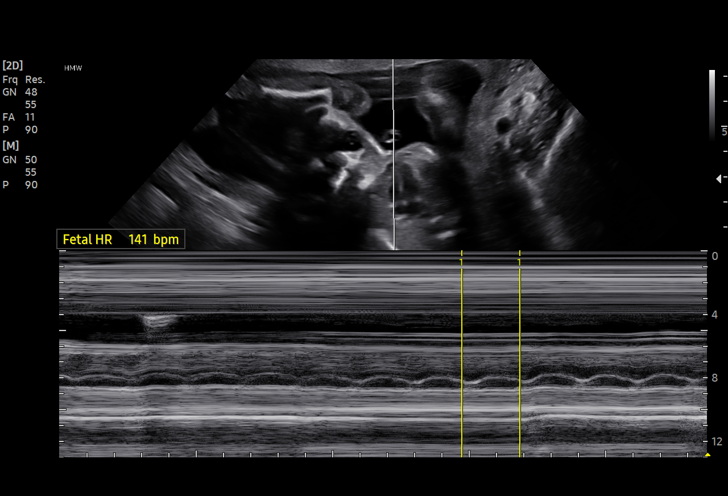
[im 21/112]
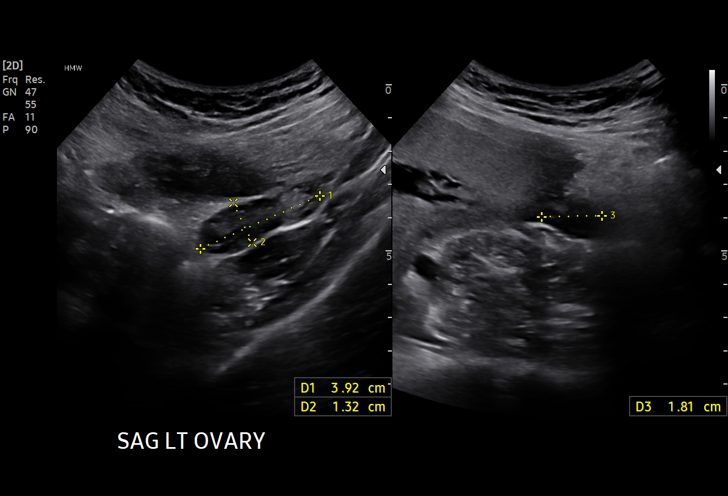
[im 33/112]
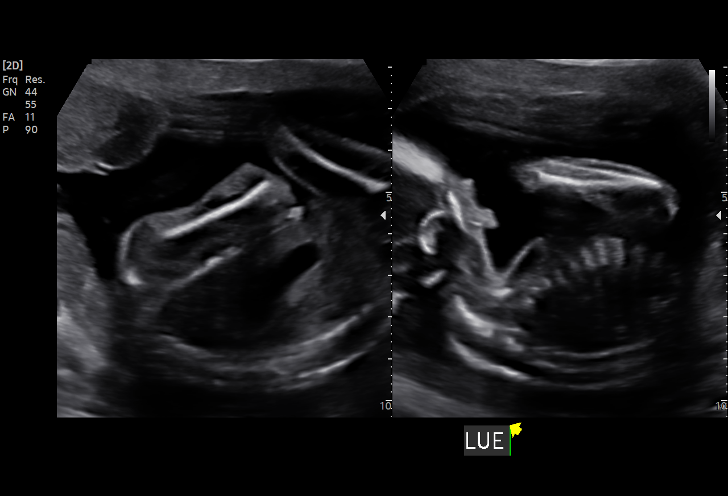
[im 42/112]
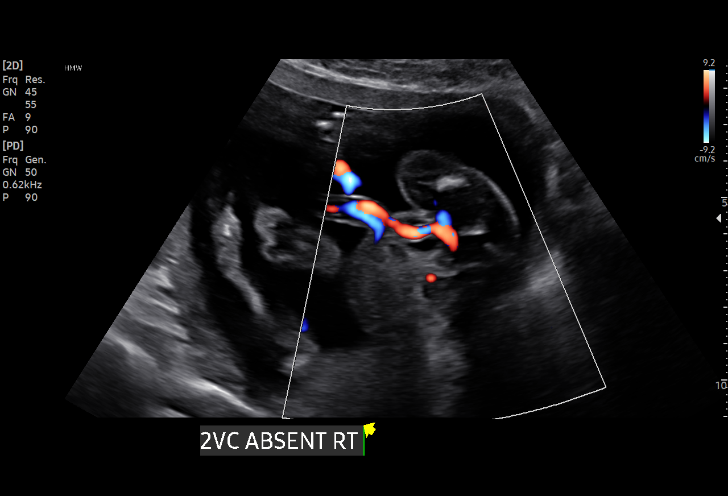
[im 50/112]
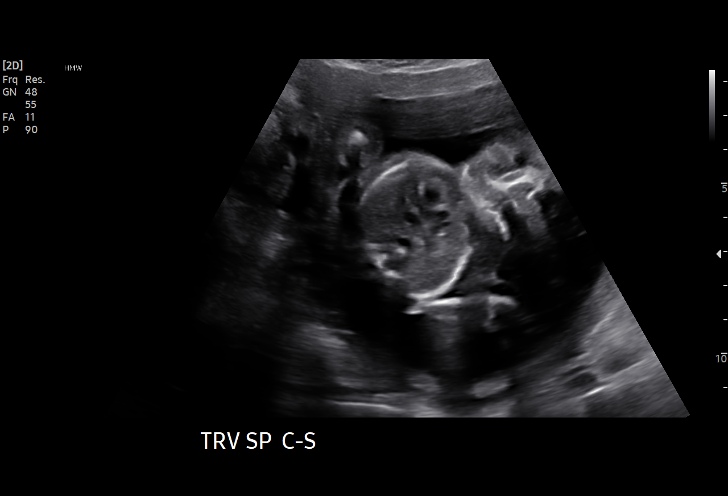
[im 62/112]
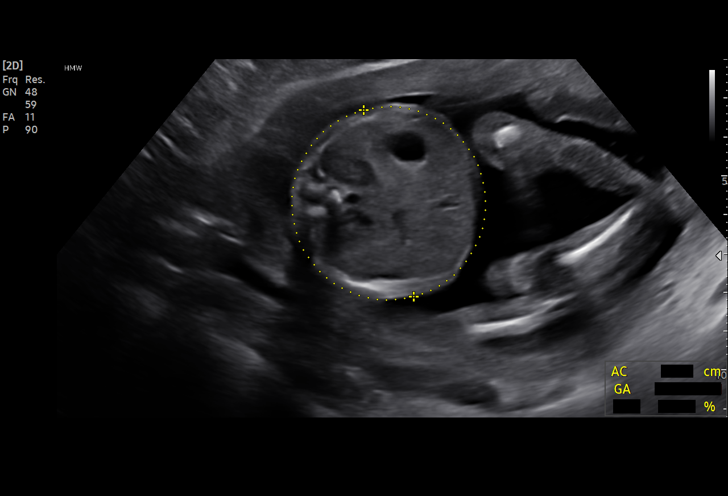
[im 70/112]
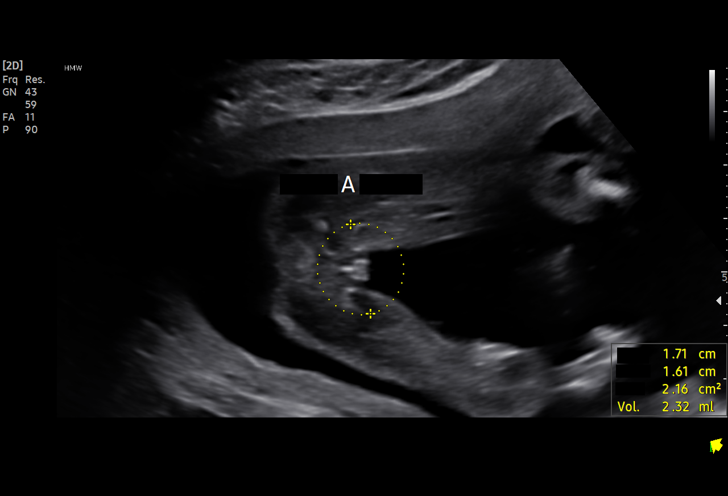
[im 79/112]
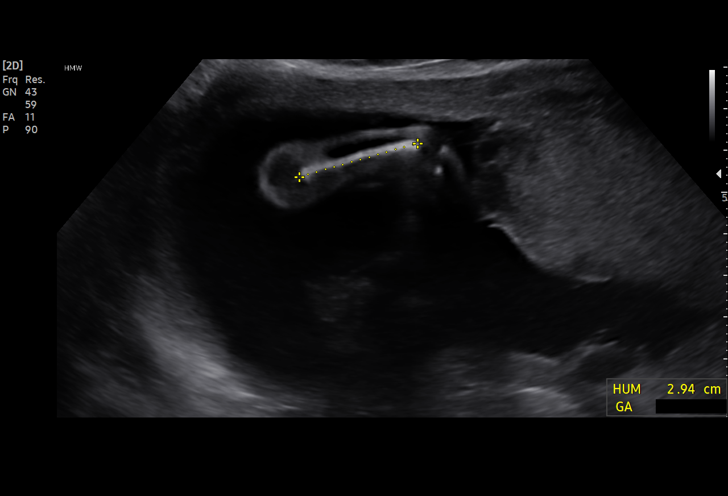
[im 91/112]
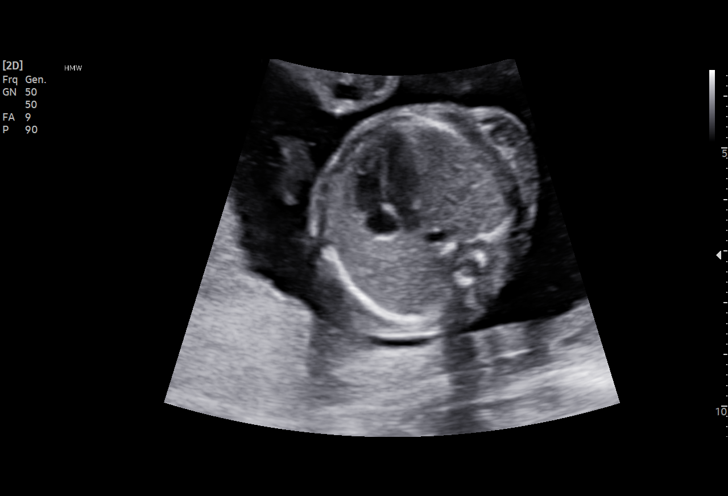
[im 99/112]
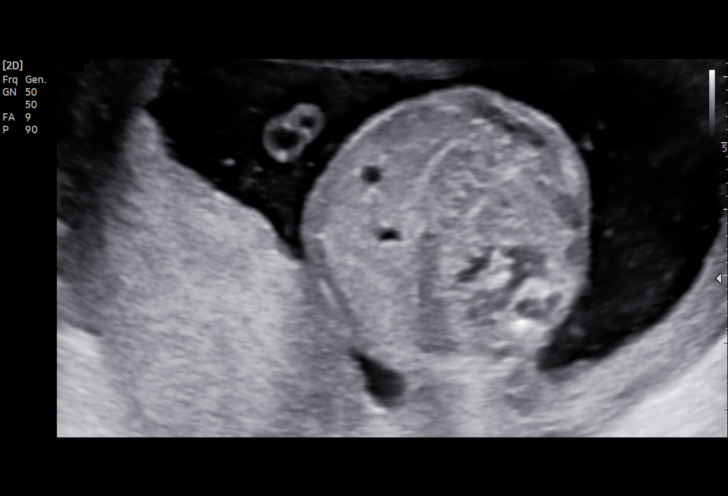
[im 107/112]
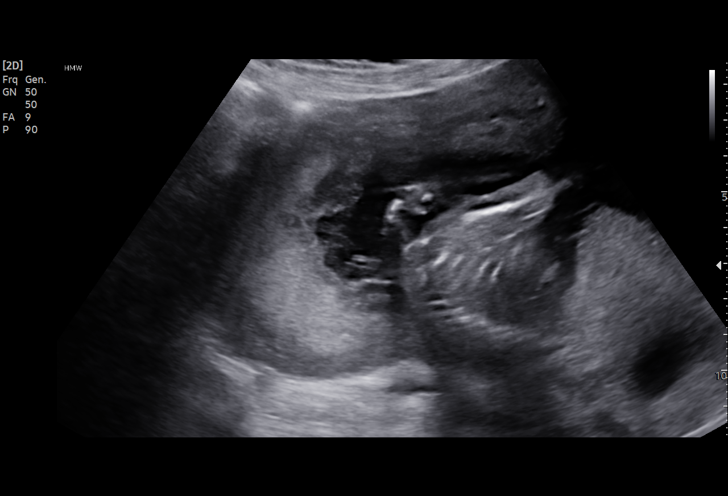

[12 of 28 positions shown; findings below may reference images not displayed]

Obstetrics &
                                                            Gynecology
                                                            [id number]
                                                            [place]

Indications

 Advanced maternal age multigravida 34+,
 second trimester
 2 vessel umbilical cord
 Uterine size-date discrepancy, second
 trimester (Small for GA)
 Encounter for antenatal screening for
 malformations
 21 weeks gestation of pregnancy
Fetal Evaluation

 Num Of Fetuses:         1
 Fetal Heart Rate(bpm):  141
 Cardiac Activity:       Observed
 Presentation:           Cephalic
 Placenta:               Left lateral
 P. Cord Insertion:      Visualized

 Amniotic Fluid
 AFI FV:      Within normal limits

                             Largest Pocket(cm)

Biometry

 BPD:      46.4  mm     G. Age:  20w 0d         11  %    CI:        73.92   %    70 - 86
                                                         FL/HC:      18.0   %    15.9 -
 HC:      171.4  mm     G. Age:  19w 5d        2.5  %    HC/AC:      1.11        1.06 -
 AC:       154   mm     G. Age:  20w 4d         26  %    FL/BPD:     66.4   %
 FL:       30.8  mm     G. Age:  19w 4d        4.2  %    FL/AC:      20.0   %    20 - 24
 HUM:      29.3  mm     G. Age:  19w 4d          5  %
 CER:      20.2  mm     G. Age:  19w 3d         12  %
 NFT:       2.7  mm
 CM:        3.3  mm

 Est. FW:     330  gm    0 lb 12 oz       6  %
OB History

 Gravidity:    3         Term:   0        Prem:   0        SAB:   1
 TOP:          1       Ectopic:  0        Living: 0
Gestational Age

 LMP:           21w 1d        Date:  01/10/20                 EDD:   10/16/20
 U/S Today:     20w 0d                                        EDD:   10/24/20
 Best:          21w 1d     Det. By:  LMP  (01/10/20)          EDD:   10/16/20
Anatomy

 Cranium:               Appears normal         LVOT:                   Not well visualized
 Cavum:                 Appears normal         Aortic Arch:            Appears normal
 Ventricles:            Not well visualized    Ductal Arch:            Not well visualized
 Choroid Plexus:        Appears normal         Diaphragm:              Appears normal
 Cerebellum:            Appears normal         Stomach:                Appears normal, left
                                                                       sided
 Posterior Fossa:       Appears normal         Abdomen:                Appears normal
 Nuchal Fold:           Not well visualized    Abdominal Wall:         Appears nml (cord
                                                                       insert, abd wall)
 Face:                  Orbits appear          Cord Vessels:           2 vessel cord,
                        normal
                                                                       absent right Bijal Altieri
 Lips:                  Appears normal         Kidneys:                Appear normal
 Palate:                Not well visualized    Bladder:                Appears normal
 Thoracic:              Appears normal         Spine:                  Not well visualized
 Heart:                 Not well visualized    Upper Extremities:      Visualized
 RVOT:                  Not well visualized    Lower Extremities:      Visualized

 Other:  Fetus appears to be female. Left heel and 5th digit visualized.
Doppler - Fetal Vessels

 Umbilical Artery
  S/D     %tile      RI    %tile                      PSV    ADFV    RDFV
                                                    (cm/s)
  6.22   > 97.5    0.[REDACTED]      No      No

Cervix Uterus Adnexa
 Cervix
 Length:           2.98  cm.
 Normal appearance by transabdominal scan.

 Right Ovary
 Simple cyst, measuring 6.8 x 4.9 x 5.1cm.

 Left Ovary
 Within normal limits.

 Adnexa
 No abnormality visualized.
Comments

 This patient was seen for a detailed fetal anatomy scan due
 to advanced maternal age.  The patient had an ultrasound
 performed in your office that showed an EFW that was at the
 3rd percentile for her gestational age.  A two-vessel umbilical
 cord was also noted during that exam.
 The patient denies any significant past medical history and
 denies any problems in her current pregnancy.
 She has not had any screening tests for fetal aneuploidy
 drawn in her current pregnancy.
 The fetal biometry measurements obtained today show an
 EFW that was at the 6 percentile for her gestational age.
 There was normal amniotic fluid noted.
 Doppler studies of the umbilical arteries performed today
 showed an elevated S/D ratio of 6.22.  There were no signs
 of absent or reversed end-diastolic flow noted today.
 A two-vessel umbilical cord was noted on today's exam.
 The implications and management of a two-vessel umbilical
 cord was discussed in detail with the patient today.  She was
 advised regarding the small association of trisomy 18 with a
 two-vessel cord. The patient was reassured that as there
 were no other anomalies noted on today's exam, that it is
 highly unlikely that her baby will have trisomy 18, although
 the smaller fetal size that has been noted throughout her
 pregnancy may place her at increased risk for having an
 aneuploid fetus.
 Due to the small possibility of trisomy 18, the patient was
 offered and declined an amniocentesis today for definitive
 diagnosis of fetal aneuploidy.  The patient will have a cell free
 DNA test drawn today to screen for fetal aneuploidy.  Our
 genetic counselor will notify the patient regarding the cell free
 DNA test results.  The small risk of fetal growth issues later in
 her pregnancy due to the two-vessel cord was also discussed
 today.
 The patient was reassured that should the cell free DNA test
 be negative, that the two-vessel cord is most likely a normal
 variant and that her baby will most likely not be affected by
 this finding after delivery.
 The patient was informed that anomalies may be missed due
 to technical limitations. If the fetus is in a suboptimal position
 or maternal habitus is increased, visualization of the fetus in
 the maternal uterus may be impaired.
 Due to the two-vessel cord noted today, the patient was
 referred for a fetal echocardiogram with [facility]
 [facility].
 A follow-up exam was scheduled in 3 weeks to assess the
 fetal growth.
